# Patient Record
Sex: Female | Born: 1975 | Race: White | Hispanic: No | Marital: Married | Smoking: Never smoker
Health system: Southern US, Community
[De-identification: ages and names within clinical notes are randomized; demographics above are authoritative.]

## PROBLEM LIST (undated history)

## (undated) DIAGNOSIS — G43909 Migraine, unspecified, not intractable, without status migrainosus: Secondary | ICD-10-CM

## (undated) DIAGNOSIS — D219 Benign neoplasm of connective and other soft tissue, unspecified: Secondary | ICD-10-CM

## (undated) DIAGNOSIS — B019 Varicella without complication: Secondary | ICD-10-CM

## (undated) HISTORY — DX: Migraine, unspecified, not intractable, without status migrainosus: G43.909

## (undated) HISTORY — DX: Benign neoplasm of connective and other soft tissue, unspecified: D21.9

## (undated) HISTORY — DX: Varicella without complication: B01.9

## (undated) HISTORY — PX: BUNIONECTOMY: SHX129

---

## 2006-03-28 ENCOUNTER — Inpatient Hospital Stay: Payer: Self-pay | Admitting: Obstetrics & Gynecology

## 2015-04-06 ENCOUNTER — Ambulatory Visit (INDEPENDENT_AMBULATORY_CARE_PROVIDER_SITE_OTHER): Payer: No Typology Code available for payment source | Admitting: Sports Medicine

## 2015-04-06 ENCOUNTER — Encounter: Payer: Self-pay | Admitting: Sports Medicine

## 2015-04-06 VITALS — BP 118/41 | Ht 66.0 in | Wt 123.0 lb

## 2015-04-06 DIAGNOSIS — M25551 Pain in right hip: Secondary | ICD-10-CM | POA: Diagnosis not present

## 2015-04-06 NOTE — Progress Notes (Signed)
   Subjective:    Patient ID: Summer Wilson, female    DOB: January 08, 1976, 39 y.o.   MRN: [id number]  HPI chief complaint: Right hip and leg pain  Very pleasant 39 year old female comes in today complaining of diffuse right hip and leg pain. Symptoms have been present intermittently for the past couple of years but became more constant in December of 2015. She describes a feeling of "muscle soreness" that tends to began in the posterior right hip and at times were radiate down the right leg. She is very active and works out 5 days a week incorporating both Editor, commissioning and cardio training. She has not found that one particular exercise causes her pain to be worse. For the most part her symptoms are tolerable but they're becoming more annoying. She denies any numbness or tingling. She denies any groin pain.  Past medical history reviewed. She has a history of migraine headaches. She takes verapamil prophylactically. Surgical history significant for first metatarsal realignment in her right foot. This was done in 2000. Allergies reviewed    Review of Systems As above    Objective:   Physical Exam Well-developed, well-nourished. Fit-appearing. No acute distress. Vital signs reviewed  Right hip: Patient is tender to palpation along the right piriformis as well as the right gluteus medius. No tenderness at the greater trochanter. Positive Trendelenburg. Negative logroll. Negative straight leg raise. Weakness with resisted hip abduction.  Right knee: Full range of motion. No effusion. Slight quad weakness but not marked. Good joint stability.  Right foot: Well-healed surgical incision across the dorsum of her foot consistent with her previous first metatarsal realignment. Flexible cavus foot.  Evaluation of her running form shows excellent form with a neutral gait. No limp.       Assessment & Plan:  Right hip and leg pain likely secondary to hip weakness  Patient will add a  comprehensive hip and knee strengthening program to her workouts. She will start hip abductor strengthening, external rotator strengthening, step ups, straight leg raises, half squats, and hamstring curls. I do not see any need for orthotics. She will follow-up with me in 6 weeks. She understands that it will be several weeks before symptom resolution. In the meantime, I think she can continue with her normal workouts as tolerated but I did caution her about deep squats and leg presses. She will call with questions or concerns prior to her follow-up visit.  Total time spent with the patient was 30 minutes with greater than 50% of the time spent in face-to-face consultation discussing diagnosis and treatment.

## 2015-05-18 ENCOUNTER — Encounter: Payer: Self-pay | Admitting: Sports Medicine

## 2015-05-18 ENCOUNTER — Ambulatory Visit (INDEPENDENT_AMBULATORY_CARE_PROVIDER_SITE_OTHER): Payer: No Typology Code available for payment source | Admitting: Sports Medicine

## 2015-05-18 VITALS — BP 101/59

## 2015-05-18 DIAGNOSIS — M25551 Pain in right hip: Secondary | ICD-10-CM | POA: Diagnosis not present

## 2015-05-18 NOTE — Progress Notes (Signed)
   Subjective:    Patient ID: Summer Wilson, female    DOB: 1976/05/20, 39 y.o.   MRN: [id number]  HPI  Patient comes in today for follow-up on right hip and leg pain secondary to hip weakness. Pain has improved dramatically over the past 6 weeks. She was given a comprehensive hip and lower extremity strengthening program and she has been very compliant. As a result her pain has nearly resolved. She has resumed some running without pain. She is pleased with her progress to date.    Review of Systems     Objective:   Physical Exam  Well-developed, fit appearing. No acute distress  Right hip: There is no tenderness to palpation along the piriformis. Mildly positive Trendelenburg but much improved from her previous visit. Much improved hip abductor strength as well. Neurovascularly intact distally.       Assessment & Plan:  Improved right hip and leg pain secondary to hip weakness  Patient understands the importance of continuing with her home exercises. She may continue with activity as tolerated and will follow-up with me as needed.

## 2015-10-19 ENCOUNTER — Other Ambulatory Visit: Payer: Self-pay | Admitting: Obstetrics & Gynecology

## 2015-12-24 ENCOUNTER — Other Ambulatory Visit: Payer: Self-pay | Admitting: Obstetrics & Gynecology

## 2015-12-24 DIAGNOSIS — Z1231 Encounter for screening mammogram for malignant neoplasm of breast: Secondary | ICD-10-CM

## 2015-12-29 ENCOUNTER — Ambulatory Visit
Admission: RE | Admit: 2015-12-29 | Discharge: 2015-12-29 | Disposition: A | Discharge status: Home / Self Care | Payer: No Typology Code available for payment source | Source: Ambulatory Visit | Attending: Obstetrics & Gynecology | Admitting: Obstetrics & Gynecology

## 2015-12-29 DIAGNOSIS — Z1231 Encounter for screening mammogram for malignant neoplasm of breast: Secondary | ICD-10-CM | POA: Insufficient documentation

## 2016-01-04 ENCOUNTER — Other Ambulatory Visit: Payer: Self-pay | Admitting: Obstetrics & Gynecology

## 2016-01-04 DIAGNOSIS — R928 Other abnormal and inconclusive findings on diagnostic imaging of breast: Secondary | ICD-10-CM

## 2016-01-06 ENCOUNTER — Ambulatory Visit
Admission: RE | Admit: 2016-01-06 | Discharge: 2016-01-06 | Disposition: A | Discharge status: Home / Self Care | Payer: No Typology Code available for payment source | Source: Ambulatory Visit | Attending: Obstetrics & Gynecology | Admitting: Obstetrics & Gynecology

## 2016-01-06 DIAGNOSIS — N63 Unspecified lump in breast: Secondary | ICD-10-CM | POA: Insufficient documentation

## 2016-01-06 DIAGNOSIS — R928 Other abnormal and inconclusive findings on diagnostic imaging of breast: Secondary | ICD-10-CM

## 2016-01-06 DIAGNOSIS — N6489 Other specified disorders of breast: Secondary | ICD-10-CM | POA: Diagnosis present

## 2016-11-30 ENCOUNTER — Other Ambulatory Visit: Payer: Self-pay | Admitting: Obstetrics & Gynecology

## 2016-11-30 DIAGNOSIS — R928 Other abnormal and inconclusive findings on diagnostic imaging of breast: Secondary | ICD-10-CM

## 2016-11-30 DIAGNOSIS — N6009 Solitary cyst of unspecified breast: Secondary | ICD-10-CM

## 2016-11-30 DIAGNOSIS — Z1231 Encounter for screening mammogram for malignant neoplasm of breast: Secondary | ICD-10-CM

## 2016-12-29 ENCOUNTER — Ambulatory Visit
Admission: RE | Admit: 2016-12-29 | Discharge: 2016-12-29 | Disposition: A | Discharge status: Home / Self Care | Payer: No Typology Code available for payment source | Source: Ambulatory Visit | Attending: Obstetrics & Gynecology | Admitting: Obstetrics & Gynecology

## 2016-12-29 DIAGNOSIS — R928 Other abnormal and inconclusive findings on diagnostic imaging of breast: Secondary | ICD-10-CM | POA: Insufficient documentation

## 2016-12-29 DIAGNOSIS — N6009 Solitary cyst of unspecified breast: Secondary | ICD-10-CM

## 2016-12-29 DIAGNOSIS — Z1231 Encounter for screening mammogram for malignant neoplasm of breast: Secondary | ICD-10-CM

## 2018-01-08 ENCOUNTER — Other Ambulatory Visit: Payer: Self-pay | Admitting: Obstetrics & Gynecology

## 2018-01-08 DIAGNOSIS — Z1231 Encounter for screening mammogram for malignant neoplasm of breast: Secondary | ICD-10-CM

## 2018-01-15 ENCOUNTER — Ambulatory Visit
Admission: RE | Admit: 2018-01-15 | Discharge: 2018-01-15 | Disposition: A | Discharge status: Home / Self Care | Payer: No Typology Code available for payment source | Source: Ambulatory Visit | Attending: Obstetrics & Gynecology | Admitting: Obstetrics & Gynecology

## 2018-01-15 DIAGNOSIS — Z1231 Encounter for screening mammogram for malignant neoplasm of breast: Secondary | ICD-10-CM | POA: Diagnosis present

## 2018-01-15 DIAGNOSIS — R928 Other abnormal and inconclusive findings on diagnostic imaging of breast: Secondary | ICD-10-CM | POA: Insufficient documentation

## 2018-01-16 ENCOUNTER — Other Ambulatory Visit: Payer: Self-pay | Admitting: Obstetrics & Gynecology

## 2018-01-16 DIAGNOSIS — R928 Other abnormal and inconclusive findings on diagnostic imaging of breast: Secondary | ICD-10-CM

## 2018-01-23 ENCOUNTER — Encounter: Payer: Self-pay | Admitting: Obstetrics & Gynecology

## 2018-01-24 ENCOUNTER — Ambulatory Visit: Payer: Self-pay | Admitting: Obstetrics & Gynecology

## 2018-01-25 ENCOUNTER — Ambulatory Visit
Admission: RE | Admit: 2018-01-25 | Discharge: 2018-01-25 | Disposition: A | Discharge status: Home / Self Care | Payer: No Typology Code available for payment source | Source: Ambulatory Visit | Attending: Obstetrics & Gynecology | Admitting: Obstetrics & Gynecology

## 2018-01-25 DIAGNOSIS — R928 Other abnormal and inconclusive findings on diagnostic imaging of breast: Secondary | ICD-10-CM | POA: Insufficient documentation

## 2018-01-25 DIAGNOSIS — N6001 Solitary cyst of right breast: Secondary | ICD-10-CM | POA: Insufficient documentation

## 2018-01-29 ENCOUNTER — Other Ambulatory Visit: Payer: No Typology Code available for payment source

## 2018-01-29 ENCOUNTER — Ambulatory Visit: Payer: No Typology Code available for payment source

## 2018-02-09 ENCOUNTER — Ambulatory Visit (INDEPENDENT_AMBULATORY_CARE_PROVIDER_SITE_OTHER): Payer: No Typology Code available for payment source | Admitting: Obstetrics & Gynecology

## 2018-02-09 ENCOUNTER — Encounter: Payer: Self-pay | Admitting: Obstetrics & Gynecology

## 2018-02-09 VITALS — BP 100/60 | HR 78 | Ht 66.0 in | Wt 129.0 lb

## 2018-02-09 DIAGNOSIS — Z1329 Encounter for screening for other suspected endocrine disorder: Secondary | ICD-10-CM

## 2018-02-09 DIAGNOSIS — Z131 Encounter for screening for diabetes mellitus: Secondary | ICD-10-CM

## 2018-02-09 DIAGNOSIS — Z13220 Encounter for screening for lipoid disorders: Secondary | ICD-10-CM

## 2018-02-09 DIAGNOSIS — Z1321 Encounter for screening for nutritional disorder: Secondary | ICD-10-CM

## 2018-02-09 DIAGNOSIS — Z0000 Encounter for general adult medical examination without abnormal findings: Secondary | ICD-10-CM

## 2018-02-09 NOTE — Progress Notes (Signed)
HPI:      [name] is a 42 y.o. [id number] who LMP was Patient's last menstrual period was 01/22/2018., she presents today for her annual examination. The patient has no complaints today. The patient is sexually active. Her last pap: approximate date 2018 and was normal and last mammogram: approximate date 2019 (March) and was abnormal: she requires repeat testing due to right breast density; [place] and [facility] MMG were normal w recommendations for follow up in 6 months. The patient does perform self breast exams.  There is notable family history of breast or ovarian cancer in her family.  The patient has regular exercise: yes.  The patient denies current symptoms of depression.    GYN History: Contraception: none  PMHx: Past Medical History:  Diagnosis Date  . Migraine    History reviewed. No pertinent surgical history. Family History  Problem Relation Age of Onset  . Diabetes Father   . Heart disease Father   . Hyperlipidemia Father   . Hypertension Father   . Stroke Maternal Grandmother   . Colon cancer Paternal Grandfather   . Brain cancer Maternal Aunt   . Brain cancer Maternal Uncle   . Breast cancer Cousin    Social History   Tobacco Use  . Smoking status: Never Smoker  . Smokeless tobacco: Never Used  Substance Use Topics  . Alcohol use: Yes    Alcohol/week: 0.0 oz  . Drug use: Never    Current Outpatient Medications:  .  verapamil (CALAN) 120 MG tablet, , Disp: , Rfl:  Allergies: Macrobid [nitrofurantoin macrocrystal]  Review of Systems  Constitutional: Negative for chills, fever and malaise/fatigue.  HENT: Negative for congestion, sinus pain and sore throat.   Eyes: Negative for blurred vision and pain.  Respiratory: Negative for cough and wheezing.   Cardiovascular: Negative for chest pain and leg swelling.  Gastrointestinal: Negative for abdominal pain, constipation, diarrhea, heartburn, nausea and vomiting.  Genitourinary: Negative for dysuria, frequency,  hematuria and urgency.  Musculoskeletal: Negative for back pain, joint pain, myalgias and neck pain.  Skin: Negative for itching and rash.  Neurological: Negative for dizziness, tremors and weakness.  Endo/Heme/Allergies: Does not bruise/bleed easily.  Psychiatric/Behavioral: Negative for depression. The patient is not nervous/anxious and does not have insomnia.     Objective: BP 100/60   Pulse 78   Ht 5\' 6"  (1.676 m)   Wt 129 lb (58.5 kg)   LMP 01/22/2018   BMI 20.82 kg/m   Filed Weights   02/09/18 0854  Weight: 129 lb (58.5 kg)   Body mass index is 20.82 kg/m. Physical Exam  Constitutional: She is oriented to person, place, and time. She appears well-developed and well-nourished. No distress.  Genitourinary: Rectum normal, vagina normal and uterus normal. Pelvic exam was performed with patient supine. There is no rash or lesion on the right labia. There is no rash or lesion on the left labia. Vagina exhibits no lesion. No bleeding in the vagina. Right adnexum does not display mass and does not display tenderness. Left adnexum does not display mass and does not display tenderness. Cervix does not exhibit motion tenderness, lesion, friability or polyp.   Uterus is mobile and midaxial. Uterus is not enlarged or exhibiting a mass.  HENT:  Head: Normocephalic and atraumatic. Head is without laceration.  Right Ear: Hearing normal.  Left Ear: Hearing normal.  Nose: No epistaxis.  No foreign bodies.  Mouth/Throat: Uvula is midline, oropharynx is clear and moist and mucous membranes are  normal.  Eyes: Pupils are equal, round, and reactive to light.  Neck: Normal range of motion. Neck supple. No thyromegaly present.  Cardiovascular: Normal rate and regular rhythm. Exam reveals no gallop and no friction rub.  No murmur heard. Pulmonary/Chest: Effort normal and breath sounds normal. No respiratory distress. She has no wheezes. Right breast exhibits no mass, no skin change and no tenderness.  Left breast exhibits no mass, no skin change and no tenderness.  Abdominal: Soft. Bowel sounds are normal. She exhibits no distension. There is no tenderness. There is no rebound.  Musculoskeletal: Normal range of motion.  Neurological: She is alert and oriented to person, place, and time. No cranial nerve deficit.  Skin: Skin is warm and dry.  Psychiatric: She has a normal mood and affect. Judgment normal.  Vitals reviewed.   Assessment:  ANNUAL EXAM 1. Annual physical exam   2. Screening for thyroid disorder   3. Screening for cholesterol level   4. Encounter for vitamin deficiency screening   5. Screening for diabetes mellitus      Screening Plan:            1.  Cervical Screening-  Pap smear schedule reviewed with patient  2. Breast screening- Exam annually and mammogram>40 planned   3. Colonoscopy every 10 years, Hemoccult testing - after age 45  4. Labs To return fasting at a later date  5. Counseling for contraception: no method, counseled as to IUD, BTL, or vasectomy   6. Rec Calcium and Vit D  Other:  1. Annual physical exam  2. Screening for thyroid disorder - TSH; Future  3. Screening for cholesterol level - Lipid panel; Future  4. Encounter for vitamin deficiency screening - Vitamin B12; Future - VITAMIN D 25 Hydroxy (Vit-D Deficiency, Fractures); Future  5. Screening for diabetes mellitus - Hemoglobin A1c; Future    F/U  Return in about 1 year (around 02/10/2019) for Annual and Lab Appt Soon.  Barnett Applebaum, MD, [facility], [facility] 02/09/2018  9:35 AM

## 2018-02-09 NOTE — Patient Instructions (Signed)
Calcium; Vitamin D oral tablets or Chewables What is this medicine? CALCIUM; VITAMIN D (KAL see um; VYE ta min D) is a vitamin supplement. It is used to prevent conditions of low calcium and vitamin D. This medicine may be used for other purposes; ask your health care provider or pharmacist if you have questions. COMMON BRAND NAME(S): Calcarb 600 with Vitamin D, Calcet Plus Vitamin D, Calcitrate + D, Calcium Citrate + D3 Maximum, Calcium Citrate Maximum with D, Caltrate, Caltrate 600+D, Citracal + D, Citracal MAXIMUM + D, Citracal Petites with Vitamin D, Citrus Calcium Plus D, OSCAL 500 + D, OSCAL Calcium + D3, OSCAL Extra D3, Osteo-Poretical, Oysco 500 + D, Oysco D, Oystercal-D Calcium What should I tell my health care provider before I take this medicine? They need to know if you have any of these conditions: -constipation -dehydration -heart disease -high level of calcium or vitamin D in the blood -high level of phosphate in the blood -kidney disease -kidney stones -liver disease -parathyroid disease -sarcoidosis -stomach ulcer or obstruction -an unusual or allergic reaction to calcium, vitamin D, tartrazine dye, other medicines, foods, dyes, or preservatives -pregnant or trying to get pregnant -breast-feeding How should I use this medicine? Take this medicine by mouth with a glass of water. Follow the directions on the label. Take with food or within 1 hour after a meal. Take your medicine at regular intervals. Do not take your medicine more often than directed. Talk to your pediatrician regarding the use of this medicine in children. While this medicine may be used in children for selected conditions, precautions do apply. Overdosage: If you think you have taken too much of this medicine contact a poison control center or emergency room at once. NOTE: This medicine is only for you. Do not share this medicine with others. What if I miss a dose? If you miss a dose, take it as soon as  you can. If it is almost time for your next dose, take only that dose. Do not take double or extra doses. What may interact with this medicine? Do not take this medicine with any of the following medications: -ammonium chloride -methenamine This medicine may also interact with the following medications: -antibiotics like ciprofloxacin, gatifloxacin, tetracycline -captopril -delavirdine -diuretics -gabapentin -iron supplements -medicines for fungal infections like ketoconazole and itraconazole -medicines for seizures like ethotoin and phenytoin -mineral oil -mycophenolate -other vitamins with calcium, vitamin D, or minerals -quinidine -rosuvastatin -sucralfate -thyroid medicine This list may not describe all possible interactions. Give your health care provider a list of all the medicines, herbs, non-prescription drugs, or dietary supplements you use. Also tell them if you smoke, drink alcohol, or use illegal drugs. Some items may interact with your medicine. What should I watch for while using this medicine? Taking this medicine is not a substitute for a well-balanced diet and exercise. Talk with your doctor or health care provider and follow a healthy lifestyle. Do not take this medicine with high-fiber foods, large amounts of alcohol, or drinks containing caffeine. Do not take this medicine within 2 hours of any other medicines. What side effects may I notice from receiving this medicine? Side effects that you should report to your doctor or health care professional as soon as possible: -allergic reactions like skin rash, itching or hives, swelling of the face, lips, or tongue -confusion -dry mouth -high blood pressure -increased hunger or thirst -increased urination -irregular heartbeat -metallic taste -muscle or bone pain -pain when urinating -seizure -unusually weak or tired -  weight loss Side effects that usually do not require medical attention (report to your doctor or  health care professional if they continue or are bothersome): -constipation -diarrhea -headache -loss of appetite -nausea, vomiting -stomach upset This list may not describe all possible side effects. Call your doctor for medical advice about side effects. You may report side effects to FDA at 1-800-FDA-1088. Where should I keep my medicine? Keep out of the reach of children. Store at room temperature between 15 and 30 degrees C (59 and 86 degrees F). Protect from light. Keep container tightly closed. Throw away any unused medicine after the expiration date. NOTE: This sheet is a summary. It may not cover all possible information. If you have questions about this medicine, talk to your doctor, pharmacist, or health care provider.  2018 Elsevier/Gold Standard (2008-02-20 17:56:23)

## 2018-02-12 ENCOUNTER — Other Ambulatory Visit: Payer: No Typology Code available for payment source

## 2018-02-14 ENCOUNTER — Other Ambulatory Visit: Payer: No Typology Code available for payment source

## 2018-02-14 DIAGNOSIS — Z13220 Encounter for screening for lipoid disorders: Secondary | ICD-10-CM

## 2018-02-14 DIAGNOSIS — Z1329 Encounter for screening for other suspected endocrine disorder: Secondary | ICD-10-CM

## 2018-02-14 DIAGNOSIS — Z131 Encounter for screening for diabetes mellitus: Secondary | ICD-10-CM

## 2018-02-14 DIAGNOSIS — Z1321 Encounter for screening for nutritional disorder: Secondary | ICD-10-CM

## 2018-02-15 ENCOUNTER — Encounter: Payer: Self-pay | Admitting: Obstetrics & Gynecology

## 2018-02-15 LAB — LIPID PANEL
Chol/HDL Ratio: 2.9 ratio (ref 0.0–4.4)
Cholesterol, Total: 171 mg/dL (ref 100–199)
HDL: 58 mg/dL (ref >39)
LDL CALC: 90 mg/dL (ref 0–99)
TRIGLYCERIDES: 116 mg/dL (ref 0–149)
VLDL Cholesterol Cal: 23 mg/dL (ref 5–40)

## 2018-02-15 LAB — VITAMIN D 25 HYDROXY (VIT D DEFICIENCY, FRACTURES): VIT D 25 HYDROXY: 41.3 ng/mL (ref 30.0–100.0)

## 2018-02-15 LAB — HEMOGLOBIN A1C
Est. average glucose Bld gHb Est-mCnc: 91 mg/dL
HEMOGLOBIN A1C: 4.8 % (ref 4.8–5.6)

## 2018-02-15 LAB — VITAMIN B12: VITAMIN B 12: 363 pg/mL (ref 232–1245)

## 2018-02-15 LAB — TSH: TSH: 1.62 u[IU]/mL (ref 0.450–4.500)

## 2018-07-19 ENCOUNTER — Telehealth: Payer: Self-pay

## 2018-07-19 ENCOUNTER — Other Ambulatory Visit: Payer: Self-pay | Admitting: Obstetrics & Gynecology

## 2018-07-19 DIAGNOSIS — N6001 Solitary cyst of right breast: Secondary | ICD-10-CM

## 2018-07-19 DIAGNOSIS — R928 Other abnormal and inconclusive findings on diagnostic imaging of breast: Secondary | ICD-10-CM

## 2018-07-19 NOTE — Telephone Encounter (Signed)
Already in system, do they need something else?

## 2018-07-19 NOTE — Telephone Encounter (Signed)
Pt needs order for a diagnostic mammogram

## 2018-07-19 NOTE — Telephone Encounter (Signed)
lmtrc

## 2018-07-19 NOTE — Telephone Encounter (Signed)
They need you to sign the order

## 2018-08-09 ENCOUNTER — Other Ambulatory Visit: Payer: Self-pay | Admitting: Obstetrics & Gynecology

## 2018-08-09 ENCOUNTER — Telehealth: Payer: Self-pay

## 2018-08-09 DIAGNOSIS — R928 Other abnormal and inconclusive findings on diagnostic imaging of breast: Secondary | ICD-10-CM

## 2018-08-09 NOTE — Telephone Encounter (Signed)
Patient is trying to schedule mammogram. Patient states she can't schedule because it is the incorrect order and it needs to be signed. Patient is getting frustrated be she can't schedule her mammogram. Please advise

## 2018-08-09 NOTE — Telephone Encounter (Signed)
I have sent in new order for DIAGNOSTIC MMG, please check and see what the hold up is for her to be able to schedule, or help schedule for her

## 2018-08-09 NOTE — Telephone Encounter (Signed)
Patient is scheduled for Friday, 08/17/18 @ 1:20pm and is aware of appointment.

## 2018-08-09 NOTE — Telephone Encounter (Signed)
Pt needs the order for the mammo to be a 3d diagnostic or if you can sign off on the order that the mammogram office put in

## 2018-08-13 NOTE — Telephone Encounter (Signed)
Can this message be completed?

## 2018-08-17 ENCOUNTER — Ambulatory Visit
Admission: RE | Admit: 2018-08-17 | Discharge: 2018-08-17 | Disposition: A | Discharge status: Home / Self Care | Payer: No Typology Code available for payment source | Source: Ambulatory Visit | Attending: Obstetrics & Gynecology | Admitting: Obstetrics & Gynecology

## 2018-08-17 ENCOUNTER — Other Ambulatory Visit: Payer: Self-pay | Admitting: Obstetrics & Gynecology

## 2018-08-17 DIAGNOSIS — N6001 Solitary cyst of right breast: Secondary | ICD-10-CM | POA: Diagnosis not present

## 2019-02-05 ENCOUNTER — Other Ambulatory Visit: Payer: Self-pay | Admitting: Obstetrics & Gynecology

## 2019-02-05 DIAGNOSIS — N6001 Solitary cyst of right breast: Secondary | ICD-10-CM

## 2019-02-08 ENCOUNTER — Telehealth: Payer: Self-pay | Admitting: Obstetrics & Gynecology

## 2019-02-08 NOTE — Telephone Encounter (Signed)
Left message to make pt aware order in just needs to schedule

## 2019-02-08 NOTE — Telephone Encounter (Signed)
Orders signed and ready for scheduling, as radiology sees fit in these times

## 2019-02-08 NOTE — Telephone Encounter (Signed)
Patient is calling stating [facility] is sending over a order for her Mammograms. Advise patient [place] isn't in the office today will return on Monday message being sent to make him aware of the order.

## 2019-02-18 ENCOUNTER — Ambulatory Visit
Admission: RE | Admit: 2019-02-18 | Discharge: 2019-02-18 | Disposition: A | Discharge status: Home / Self Care | Payer: No Typology Code available for payment source | Source: Ambulatory Visit | Attending: Obstetrics & Gynecology | Admitting: Obstetrics & Gynecology

## 2019-02-18 ENCOUNTER — Other Ambulatory Visit: Payer: Self-pay

## 2019-02-18 DIAGNOSIS — N6001 Solitary cyst of right breast: Secondary | ICD-10-CM

## 2019-02-19 ENCOUNTER — Encounter: Payer: Self-pay | Admitting: Obstetrics & Gynecology

## 2019-07-05 ENCOUNTER — Telehealth: Payer: Self-pay

## 2019-07-05 ENCOUNTER — Other Ambulatory Visit: Payer: Self-pay | Admitting: Obstetrics & Gynecology

## 2019-07-05 NOTE — Telephone Encounter (Signed)
Pt aware note at front desk

## 2019-07-05 NOTE — Telephone Encounter (Signed)
Pt states she thinks it is for liability purposes since phase 2 has started. State there are no restrictions and she needs the exercise to help lower cholesterol. Something along those lines.

## 2019-07-05 NOTE — Telephone Encounter (Signed)
What does the note need to say?

## 2019-07-05 NOTE — Telephone Encounter (Signed)
Pt states she has an upcoming annual appointment scheduled but was wanting RPH to write a note so she can get access to the gym. CB# [phone]

## 2019-07-16 ENCOUNTER — Ambulatory Visit: Payer: No Typology Code available for payment source | Admitting: Obstetrics & Gynecology

## 2019-07-17 ENCOUNTER — Ambulatory Visit: Payer: No Typology Code available for payment source | Admitting: Obstetrics & Gynecology

## 2019-08-02 ENCOUNTER — Other Ambulatory Visit (HOSPITAL_COMMUNITY)
Admission: RE | Admit: 2019-08-02 | Discharge: 2019-08-02 | Disposition: A | Discharge status: Home / Self Care | Payer: No Typology Code available for payment source | Source: Ambulatory Visit | Attending: Obstetrics & Gynecology | Admitting: Obstetrics & Gynecology

## 2019-08-02 ENCOUNTER — Encounter: Payer: Self-pay | Admitting: Obstetrics & Gynecology

## 2019-08-02 ENCOUNTER — Ambulatory Visit (INDEPENDENT_AMBULATORY_CARE_PROVIDER_SITE_OTHER): Payer: No Typology Code available for payment source | Admitting: Obstetrics & Gynecology

## 2019-08-02 ENCOUNTER — Other Ambulatory Visit: Payer: Self-pay

## 2019-08-02 VITALS — BP 100/60 | Ht 66.0 in | Wt 129.0 lb

## 2019-08-02 DIAGNOSIS — Z124 Encounter for screening for malignant neoplasm of cervix: Secondary | ICD-10-CM | POA: Diagnosis not present

## 2019-08-02 DIAGNOSIS — Z1239 Encounter for other screening for malignant neoplasm of breast: Secondary | ICD-10-CM

## 2019-08-02 DIAGNOSIS — Z01419 Encounter for gynecological examination (general) (routine) without abnormal findings: Secondary | ICD-10-CM

## 2019-08-02 NOTE — Patient Instructions (Signed)
PAP done  Mammogram every year    Call [phone] to schedule at [facility] in Mar 2021 Labs next year (2019- all normal!)

## 2019-08-02 NOTE — Progress Notes (Signed)
HPI:      [name] is a 43 y.o. [id number] who LMP was Patient's last menstrual period was 06/16/2019., she presents today for her annual examination. The patient has no complaints today. The patient is sexually active. Her last pap: approximate date 2017 and was normal and last mammogram: approximate date 01/2019 as a follow up, due again in 01/2020 and was normal. The patient does perform self breast exams.  There is notable family history of breast or ovarian cancer in her family.  The patient has regular exercise: yes.  The patient denies current symptoms of depression.    GYN History: Contraception: vasectomy  PMHx: Past Medical History:  Diagnosis Date  . Migraine    History reviewed. No pertinent surgical history. Family History  Problem Relation Age of Onset  . Diabetes Father   . Heart disease Father   . Hyperlipidemia Father   . Hypertension Father   . Stroke Maternal Grandmother   . Colon cancer Paternal Grandfather   . Brain cancer Maternal Aunt   . Brain cancer Maternal Uncle   . Breast cancer Cousin    Social History   Tobacco Use  . Smoking status: Never Smoker  . Smokeless tobacco: Never Used  Substance Use Topics  . Alcohol use: Yes    Alcohol/week: 0.0 standard drinks  . Drug use: Never    Current Outpatient Medications:  .  verapamil (CALAN) 120 MG tablet, , Disp: , Rfl:  Allergies: Macrobid [nitrofurantoin macrocrystal]  Review of Systems  Constitutional: Negative for chills, fever and malaise/fatigue.  HENT: Negative for congestion, sinus pain and sore throat.   Eyes: Negative for blurred vision and pain.  Respiratory: Negative for cough and wheezing.   Cardiovascular: Negative for chest pain and leg swelling.  Gastrointestinal: Negative for abdominal pain, constipation, diarrhea, heartburn, nausea and vomiting.  Genitourinary: Negative for dysuria, frequency, hematuria and urgency.  Musculoskeletal: Negative for back pain, joint pain,  myalgias and neck pain.  Skin: Negative for itching and rash.  Neurological: Negative for dizziness, tremors and weakness.  Endo/Heme/Allergies: Does not bruise/bleed easily.  Psychiatric/Behavioral: Negative for depression. The patient is not nervous/anxious and does not have insomnia.     Objective: BP 100/60   Ht 5\' 6"  (1.676 m)   Wt 129 lb (58.5 kg)   LMP 06/16/2019   BMI 20.82 kg/m   Filed Weights   08/02/19 1006  Weight: 129 lb (58.5 kg)   Body mass index is 20.82 kg/m. Physical Exam Constitutional:      General: She is not in acute distress.    Appearance: She is well-developed.  Genitourinary:     Pelvic exam was performed with patient supine.     Vagina, uterus and rectum normal.     No lesions in the vagina.     No vaginal bleeding.     No cervical motion tenderness, friability, lesion or polyp.     Uterus is mobile.     Uterus is not enlarged.     No uterine mass detected.    Uterus is midaxial.     No right or left adnexal mass present.     Right adnexa not tender.     Left adnexa not tender.  HENT:     Head: Normocephalic and atraumatic. No laceration.     Right Ear: Hearing normal.     Left Ear: Hearing normal.     Mouth/Throat:     Pharynx: Uvula midline.  Eyes:  Pupils: Pupils are equal, round, and reactive to light.  Neck:     Musculoskeletal: Normal range of motion and neck supple.     Thyroid: No thyromegaly.  Cardiovascular:     Rate and Rhythm: Normal rate and regular rhythm.     Heart sounds: No murmur. No friction rub. No gallop.   Pulmonary:     Effort: Pulmonary effort is normal. No respiratory distress.     Breath sounds: Normal breath sounds. No wheezing.  Chest:     Breasts:        Right: No mass, skin change or tenderness.        Left: No mass, skin change or tenderness.  Abdominal:     General: Bowel sounds are normal. There is no distension.     Palpations: Abdomen is soft.     Tenderness: There is no abdominal tenderness.  There is no rebound.  Musculoskeletal: Normal range of motion.  Neurological:     Mental Status: She is alert and oriented to person, place, and time.     Cranial Nerves: No cranial nerve deficit.  Skin:    General: Skin is warm and dry.  Psychiatric:        Judgment: Judgment normal.  Vitals signs reviewed.     Assessment:  ANNUAL EXAM 1. Women's annual routine gynecological examination   2. Screening for breast cancer   3. Screening for cervical cancer      Screening Plan:            1.  Cervical Screening-  Pap smear done today  2. Breast screening- Exam annually and mammogram>40 planned; ordered for 01/2020    3. Labs managed by PCP   4. No ERT or OCP due to migraine SE to estrogen in the past  5. Counseling for contraception: vasectomy      F/U  Return in about 1 year (around 08/01/2020) for Annual.  Barnett Applebaum, MD, [facility], [facility] 08/02/2019  10:35 AM

## 2019-08-07 LAB — CYTOLOGY - PAP
Adequacy: ABSENT
Diagnosis: NEGATIVE
HPV: NOT DETECTED

## 2020-05-18 ENCOUNTER — Ambulatory Visit
Admission: RE | Admit: 2020-05-18 | Discharge: 2020-05-18 | Disposition: A | Discharge status: Home / Self Care | Payer: No Typology Code available for payment source | Source: Ambulatory Visit | Attending: Obstetrics & Gynecology | Admitting: Obstetrics & Gynecology

## 2020-05-18 DIAGNOSIS — Z1231 Encounter for screening mammogram for malignant neoplasm of breast: Secondary | ICD-10-CM | POA: Diagnosis not present

## 2020-05-18 DIAGNOSIS — Z1239 Encounter for other screening for malignant neoplasm of breast: Secondary | ICD-10-CM

## 2020-05-19 ENCOUNTER — Telehealth: Payer: Self-pay

## 2020-05-19 ENCOUNTER — Other Ambulatory Visit: Payer: Self-pay | Admitting: Obstetrics & Gynecology

## 2020-05-19 DIAGNOSIS — N6320 Unspecified lump in the left breast, unspecified quadrant: Secondary | ICD-10-CM

## 2020-05-19 DIAGNOSIS — R928 Other abnormal and inconclusive findings on diagnostic imaging of breast: Secondary | ICD-10-CM

## 2020-05-19 DIAGNOSIS — N6489 Other specified disorders of breast: Secondary | ICD-10-CM

## 2020-05-19 NOTE — Telephone Encounter (Signed)
Already done

## 2020-05-19 NOTE — Telephone Encounter (Signed)
Pt needs you to put in a order for a follow up mammogram, she is leaving to go out of Tuesday, she would like to get this done before she leaves

## 2020-05-20 NOTE — Telephone Encounter (Signed)
Pt aware.

## 2020-05-21 ENCOUNTER — Ambulatory Visit
Admission: RE | Admit: 2020-05-21 | Discharge: 2020-05-21 | Disposition: A | Discharge status: Home / Self Care | Payer: No Typology Code available for payment source | Source: Ambulatory Visit | Attending: Obstetrics & Gynecology | Admitting: Obstetrics & Gynecology

## 2020-05-21 DIAGNOSIS — R928 Other abnormal and inconclusive findings on diagnostic imaging of breast: Secondary | ICD-10-CM

## 2020-05-21 DIAGNOSIS — N6320 Unspecified lump in the left breast, unspecified quadrant: Secondary | ICD-10-CM

## 2020-05-21 DIAGNOSIS — N6489 Other specified disorders of breast: Secondary | ICD-10-CM | POA: Diagnosis present

## 2020-08-04 ENCOUNTER — Encounter: Payer: Self-pay | Admitting: Obstetrics & Gynecology

## 2020-08-04 ENCOUNTER — Ambulatory Visit (INDEPENDENT_AMBULATORY_CARE_PROVIDER_SITE_OTHER): Payer: No Typology Code available for payment source | Admitting: Obstetrics & Gynecology

## 2020-08-04 ENCOUNTER — Other Ambulatory Visit: Payer: Self-pay

## 2020-08-04 VITALS — BP 120/80 | Ht 65.0 in | Wt 130.0 lb

## 2020-08-04 DIAGNOSIS — Z01419 Encounter for gynecological examination (general) (routine) without abnormal findings: Secondary | ICD-10-CM | POA: Diagnosis not present

## 2020-08-04 DIAGNOSIS — Z1321 Encounter for screening for nutritional disorder: Secondary | ICD-10-CM

## 2020-08-04 DIAGNOSIS — Z23 Encounter for immunization: Secondary | ICD-10-CM | POA: Diagnosis not present

## 2020-08-04 DIAGNOSIS — Z131 Encounter for screening for diabetes mellitus: Secondary | ICD-10-CM | POA: Diagnosis not present

## 2020-08-04 DIAGNOSIS — Z13220 Encounter for screening for lipoid disorders: Secondary | ICD-10-CM

## 2020-08-04 DIAGNOSIS — Z1329 Encounter for screening for other suspected endocrine disorder: Secondary | ICD-10-CM

## 2020-08-04 NOTE — Patient Instructions (Addendum)
PAP every three years Mammogram every year    Call [phone] to schedule at [facility] - due  Thank you for choosing [facility]. As part of our ongoing efforts to improve patient experience, we would appreciate your feedback. Please fill out the short survey that you will receive by mail or MyChart. Your opinion is important to [place]! - Dr. Kenton Kingfisher

## 2020-08-04 NOTE — Progress Notes (Signed)
HPI:      [name] is a 44 y.o. [id number] who LMP was Patient's last menstrual period was 07/01/2020., she presents today for her annual examination. The patient has no complaints today.   Reg cycles.  Rare headache. The patient is sexually active. Her last pap: approximate date 2019 and was normal and last mammogram: approximate date 2021 and was normal and was noted to still have breast density requiring dx imaging. The patient does perform self breast exams.  There is no notable family history of breast or ovarian cancer in her family.  The patient has regular exercise: yes.  The patient denies current symptoms of depression.    GYN History: Contraception: vasectomy  PMHx: Past Medical History:  Diagnosis Date   Migraine    History reviewed. No pertinent surgical history. Family History  Problem Relation Age of Onset   Diabetes Father    Heart disease Father    Hyperlipidemia Father    Hypertension Father    Stroke Maternal Grandmother    Colon cancer Paternal Grandfather    Brain cancer Maternal Aunt    Brain cancer Maternal Uncle    Breast cancer Cousin        2nd cousin   Social History   Tobacco Use   Smoking status: Never Smoker   Smokeless tobacco: Never Used  Vaping Use   Vaping Use: Never used  Substance Use Topics   Alcohol use: Yes    Alcohol/week: 0.0 standard drinks   Drug use: Never    Current Outpatient Medications:    Multiple Vitamin (MULTI-VITAMIN) tablet, Take 1 tablet by mouth daily., Disp: , Rfl:    verapamil (CALAN) 120 MG tablet, , Disp: , Rfl:  Allergies: Macrobid [nitrofurantoin macrocrystal]  Review of Systems  Constitutional: Negative for chills, fever and malaise/fatigue.  HENT: Negative for congestion, sinus pain and sore throat.   Eyes: Negative for blurred vision and pain.  Respiratory: Negative for cough and wheezing.   Cardiovascular: Negative for chest pain and leg swelling.  Gastrointestinal: Negative  for abdominal pain, constipation, diarrhea, heartburn, nausea and vomiting.  Genitourinary: Negative for dysuria, frequency, hematuria and urgency.  Musculoskeletal: Negative for back pain, joint pain, myalgias and neck pain.  Skin: Negative for itching and rash.  Neurological: Negative for dizziness, tremors and weakness.  Endo/Heme/Allergies: Does not bruise/bleed easily.  Psychiatric/Behavioral: Negative for depression. The patient is not nervous/anxious and does not have insomnia.     Objective: BP 120/80    Ht 5\' 5"  (1.651 m)    Wt 130 lb (59 kg)    LMP 07/01/2020    BMI 21.63 kg/m   Filed Weights   08/04/20 0915  Weight: 130 lb (59 kg)   Body mass index is 21.63 kg/m. Physical Exam Constitutional:      General: She is not in acute distress.    Appearance: She is well-developed.  Genitourinary:     Pelvic exam was performed with patient supine.     Vagina, uterus and rectum normal.     No lesions in the vagina.     No vaginal bleeding.     No cervical motion tenderness, friability, lesion or polyp.     Uterus is mobile.     Uterus is not enlarged.     No uterine mass detected.    Uterus is midaxial.     No right or left adnexal mass present.     Right adnexa not tender.     Left  adnexa not tender.  HENT:     Head: Normocephalic and atraumatic. No laceration.     Right Ear: Hearing normal.     Left Ear: Hearing normal.     Mouth/Throat:     Pharynx: Uvula midline.  Eyes:     Pupils: Pupils are equal, round, and reactive to light.  Neck:     Thyroid: No thyromegaly.  Cardiovascular:     Rate and Rhythm: Normal rate and regular rhythm.     Heart sounds: No murmur heard.  No friction rub. No gallop.   Pulmonary:     Effort: Pulmonary effort is normal. No respiratory distress.     Breath sounds: Normal breath sounds. No wheezing.  Chest:     Breasts:        Right: No mass, skin change or tenderness.        Left: No mass, skin change or tenderness.  Abdominal:      General: Bowel sounds are normal. There is no distension.     Palpations: Abdomen is soft.     Tenderness: There is no abdominal tenderness. There is no rebound.  Musculoskeletal:        General: Normal range of motion.     Cervical back: Normal range of motion and neck supple.  Neurological:     Mental Status: She is alert and oriented to person, place, and time.     Cranial Nerves: No cranial nerve deficit.  Skin:    General: Skin is warm and dry.  Psychiatric:        Judgment: Judgment normal.  Vitals reviewed.     Assessment:  ANNUAL EXAM 1. Women's annual routine gynecological examination   2. Screening for cholesterol level   3. Screening for diabetes mellitus   4. Screening for thyroid disorder   5. Encounter for vitamin deficiency screening      Screening Plan:            1.  Cervical Screening-  Pap smear schedule reviewed with patient  2. Breast screening- Exam annually and mammogram>40 planned   3. Colonoscopy every 10 years, Hemoccult testing - after age 74, considering earlier  4. Labs To return fasting at a later date  5. Counseling for contraception: vasectomy No hormones due to her h/o Migraines No s/sx menopause as of yet   6/ Flu shot today    F/U  Return in about 1 year (around 08/04/2021) for Annual, and FASTING LAB appt soon.  Barnett Applebaum, MD, [facility], [facility] 08/04/2020  10:12 AM

## 2020-08-11 ENCOUNTER — Other Ambulatory Visit: Payer: Self-pay

## 2020-08-11 ENCOUNTER — Other Ambulatory Visit: Payer: No Typology Code available for payment source

## 2020-08-11 DIAGNOSIS — Z1329 Encounter for screening for other suspected endocrine disorder: Secondary | ICD-10-CM

## 2020-08-11 DIAGNOSIS — Z131 Encounter for screening for diabetes mellitus: Secondary | ICD-10-CM

## 2020-08-11 DIAGNOSIS — Z1321 Encounter for screening for nutritional disorder: Secondary | ICD-10-CM

## 2020-08-11 DIAGNOSIS — Z13220 Encounter for screening for lipoid disorders: Secondary | ICD-10-CM

## 2020-08-12 LAB — LIPID PANEL
Chol/HDL Ratio: 3.1 ratio (ref 0.0–4.4)
Cholesterol, Total: 176 mg/dL (ref 100–199)
HDL: 57 mg/dL (ref >39)
LDL Chol Calc (NIH): 100 mg/dL — ABNORMAL HIGH (ref 0–99)
Triglycerides: 106 mg/dL (ref 0–149)
VLDL Cholesterol Cal: 19 mg/dL (ref 5–40)

## 2020-08-12 LAB — VITAMIN D 25 HYDROXY (VIT D DEFICIENCY, FRACTURES): Vit D, 25-Hydroxy: 47.9 ng/mL (ref 30.0–100.0)

## 2020-08-12 LAB — TSH: TSH: 2.01 u[IU]/mL (ref 0.450–4.500)

## 2020-08-12 LAB — GLUCOSE, FASTING: Glucose, Plasma: 90 mg/dL (ref 65–99)

## 2021-06-08 ENCOUNTER — Other Ambulatory Visit: Payer: Self-pay | Admitting: Obstetrics & Gynecology

## 2021-06-08 DIAGNOSIS — Z1231 Encounter for screening mammogram for malignant neoplasm of breast: Secondary | ICD-10-CM

## 2021-06-17 ENCOUNTER — Other Ambulatory Visit: Payer: Self-pay

## 2021-06-17 ENCOUNTER — Ambulatory Visit
Admission: RE | Admit: 2021-06-17 | Discharge: 2021-06-17 | Disposition: A | Discharge status: Home / Self Care | Payer: No Typology Code available for payment source | Source: Ambulatory Visit | Attending: Obstetrics & Gynecology | Admitting: Obstetrics & Gynecology

## 2021-06-17 DIAGNOSIS — Z1231 Encounter for screening mammogram for malignant neoplasm of breast: Secondary | ICD-10-CM | POA: Diagnosis present

## 2021-08-11 ENCOUNTER — Ambulatory Visit: Payer: No Typology Code available for payment source | Admitting: Obstetrics & Gynecology

## 2021-09-06 ENCOUNTER — Ambulatory Visit: Payer: No Typology Code available for payment source | Admitting: Obstetrics & Gynecology

## 2021-10-04 ENCOUNTER — Other Ambulatory Visit: Payer: Self-pay

## 2021-10-04 ENCOUNTER — Ambulatory Visit (INDEPENDENT_AMBULATORY_CARE_PROVIDER_SITE_OTHER): Payer: No Typology Code available for payment source | Admitting: Obstetrics & Gynecology

## 2021-10-04 ENCOUNTER — Encounter: Payer: Self-pay | Admitting: Obstetrics & Gynecology

## 2021-10-04 VITALS — BP 120/80 | Ht 65.5 in | Wt 137.0 lb

## 2021-10-04 DIAGNOSIS — Z01419 Encounter for gynecological examination (general) (routine) without abnormal findings: Secondary | ICD-10-CM

## 2021-10-04 DIAGNOSIS — N858 Other specified noninflammatory disorders of uterus: Secondary | ICD-10-CM | POA: Diagnosis not present

## 2021-10-04 DIAGNOSIS — Z1211 Encounter for screening for malignant neoplasm of colon: Secondary | ICD-10-CM | POA: Diagnosis not present

## 2021-10-04 NOTE — Patient Instructions (Signed)
Recommendations to boost your immunity to prevent illness such as viral flu and colds, including covid19, are as follows:       - - -  Vitamin K2 and Vitamin D3  - - - Take Vitamin K2 at 200-300 mcg daily (usually 2-3 pills daily of the over the counter formulation). Take Vitamin D3 at 3000-4000 U daily (usually 3-4 pills daily of the over the counter formulation). Studies show that these two at high normal levels in your system are very effective in keeping your immunity so strong and protective that you will be unlikely to contract viral illness such as those listed above.  Dr Kenton Kingfisher  Thank you for choosing [facility]. As part of our ongoing efforts to improve patient experience, we would appreciate your feedback. Please fill out the short survey that you will receive by mail or MyChart. Your opinion is important to [place]! -Dr Kenton Kingfisher

## 2021-10-04 NOTE — Progress Notes (Signed)
HPI:      Summer Wilson is a 45 y.o. [id number] who LMP was Patient's last menstrual period was 09/04/2021., she presents today for her annual examination. The patient has no complaints today. The patient is sexually active. Her last pap: approximate date 2020 and was normal and last mammogram: approximate date 2022 and was normal. The patient does perform self breast exams.  There is notable family history of breast or ovarian cancer in her family.  The patient has regular exercise: yes.  The patient denies current symptoms of depression.    Cycles still regular.  No s/sx menopause.  GYN History: Contraception: vasectomy  PMHx: Past Medical History:  Diagnosis Date   Migraine    History reviewed. No pertinent surgical history. Family History  Problem Relation Age of Onset   Diabetes Father    Heart disease Father    Hyperlipidemia Father    Hypertension Father    Stroke Maternal Grandmother    Colon cancer Paternal Grandfather    Brain cancer Maternal Aunt    Brain cancer Maternal Uncle    Breast cancer Cousin        2nd cousin   Social History   Tobacco Use   Smoking status: Never   Smokeless tobacco: Never  Vaping Use   Vaping Use: Never used  Substance Use Topics   Alcohol use: Yes    Alcohol/week: 0.0 standard drinks   Drug use: Never    Current Outpatient Medications:    Multiple Vitamin (MULTI-VITAMIN) tablet, Take 1 tablet by mouth daily., Disp: , Rfl:    verapamil (CALAN) 120 MG tablet, , Disp: , Rfl:  Allergies: Macrobid [nitrofurantoin macrocrystal]  Review of Systems  Constitutional:  Negative for chills, fever and malaise/fatigue.  HENT:  Negative for congestion, sinus pain and sore throat.   Eyes:  Negative for blurred vision and pain.  Respiratory:  Negative for cough and wheezing.   Cardiovascular:  Negative for chest pain and leg swelling.  Gastrointestinal:  Negative for abdominal pain, constipation, diarrhea, heartburn, nausea and vomiting.   Genitourinary:  Negative for dysuria, frequency, hematuria and urgency.  Musculoskeletal:  Negative for back pain, joint pain, myalgias and neck pain.  Skin:  Negative for itching and rash.  Neurological:  Positive for headaches. Negative for dizziness, tremors and weakness.  Endo/Heme/Allergies:  Does not bruise/bleed easily.  Psychiatric/Behavioral:  Negative for depression. The patient is not nervous/anxious and does not have insomnia.    Objective: BP 120/80   Ht 5' 5.5" (1.664 m)   Wt 137 lb (62.1 kg)   LMP 09/04/2021   BMI 22.45 kg/m   Filed Weights   10/04/21 1424  Weight: 137 lb (62.1 kg)   Body mass index is 22.45 kg/m. Physical Exam Constitutional:      General: She is not in acute distress.    Appearance: She is well-developed.  Genitourinary:     Bladder, rectum and urethral meatus normal.     No lesions in the vagina.     Right Labia: No rash, tenderness or lesions.    Left Labia: No tenderness, lesions or rash.    No vaginal bleeding.      Right Adnexa: not tender and no mass present.    Left Adnexa: not tender and no mass present.    No cervical motion tenderness, friability, lesion or polyp.     Uterus is not enlarged.     Uterine mass present.    Uterus exam comments: Anterior to  uterus is 3 cm mass, fibroid vs right adnexal mass/cyst.     Pelvic exam was performed with patient in the lithotomy position.  Breasts:    Right: No mass, skin change or tenderness.     Left: No mass, skin change or tenderness.  HENT:     Head: Normocephalic and atraumatic. No laceration.     Right Ear: Hearing normal.     Left Ear: Hearing normal.     Mouth/Throat:     Pharynx: Uvula midline.  Eyes:     Pupils: Pupils are equal, round, and reactive to light.  Neck:     Thyroid: No thyromegaly.  Cardiovascular:     Rate and Rhythm: Normal rate and regular rhythm.     Heart sounds: No murmur heard.   No friction rub. No gallop.  Pulmonary:     Effort: Pulmonary effort  is normal. No respiratory distress.     Breath sounds: Normal breath sounds. No wheezing.  Abdominal:     General: Bowel sounds are normal. There is no distension.     Palpations: Abdomen is soft.     Tenderness: There is no abdominal tenderness. There is no rebound.  Musculoskeletal:        General: Normal range of motion.     Cervical back: Normal range of motion and neck supple.  Neurological:     Mental Status: She is alert and oriented to person, place, and time.     Cranial Nerves: No cranial nerve deficit.  Skin:    General: Skin is warm and dry.  Psychiatric:        Judgment: Judgment normal.  Vitals reviewed.    Assessment:  ANNUAL EXAM 1. Women's annual routine gynecological examination   2. Screen for colon cancer      Screening Plan:            1.  Cervical Screening-  Pap smear schedule reviewed with patient  2. Breast screening- Exam annually and mammogram>40 planned   3. Colonoscopy every 10 years, Hemoccult testing - after age 53  4. Labs  UTD; repeat next year  5. Counseling for contraception: vasectomy  The pregnancy intention screening data noted above was reviewed. Potential methods of contraception were discussed. The patient elected to proceed with Vasectomy.    6. Uterine or adnexal mass, possibly fibroid. No sx's, but pt is concerned. Plan [place] to assess.   7. No s/sx menopause. Avoid estrogen as treatment in future due to migraine hx.    F/U  Return in about 1 year (around 10/04/2022) for Annual.  Barnett Applebaum, MD, [facility], [facility] 10/04/2021  3:07 PM

## 2021-10-07 ENCOUNTER — Encounter: Payer: Self-pay | Admitting: Obstetrics & Gynecology

## 2021-10-11 ENCOUNTER — Other Ambulatory Visit: Payer: Self-pay

## 2021-10-11 ENCOUNTER — Ambulatory Visit
Admission: RE | Admit: 2021-10-11 | Discharge: 2021-10-11 | Disposition: A | Discharge status: Home / Self Care | Payer: No Typology Code available for payment source | Source: Ambulatory Visit | Attending: Obstetrics & Gynecology | Admitting: Obstetrics & Gynecology

## 2021-10-11 DIAGNOSIS — N858 Other specified noninflammatory disorders of uterus: Secondary | ICD-10-CM | POA: Diagnosis present

## 2021-10-13 ENCOUNTER — Other Ambulatory Visit: Payer: Self-pay

## 2021-10-13 DIAGNOSIS — Z1211 Encounter for screening for malignant neoplasm of colon: Secondary | ICD-10-CM

## 2021-10-13 DIAGNOSIS — G43909 Migraine, unspecified, not intractable, without status migrainosus: Secondary | ICD-10-CM | POA: Insufficient documentation

## 2021-10-13 MED ORDER — CLENPIQ 10-3.5-12 MG-GM -GM/160ML PO SOLN: 1.0000 | Freq: Once | ORAL | 0 refills | Status: AC

## 2021-10-13 NOTE — Progress Notes (Signed)
Gastroenterology Pre-Procedure Review  Request Date: 10/28/21  Requesting Physician: Dr. Allen Norris  PATIENT REVIEW QUESTIONS: The patient responded to the following health history questions as indicated:    1. Are you having any GI issues? no 2. Do you have a personal history of Polyps? no 3. Do you have a family history of Colon Cancer or Polyps? yes (P. Grandfather- colon cancer; Mother- polyps.) 4. Diabetes Mellitus? no 5. Joint replacements in the past 12 months?no 6. Major health problems in the past 3 months?no 7. Any artificial heart valves, MVP, or defibrillator?no    MEDICATIONS & ALLERGIES:    Patient reports the following regarding taking any anticoagulation/antiplatelet therapy:   Plavix, Coumadin, Eliquis, Xarelto, Lovenox, Pradaxa, Brilinta, or Effient? no Aspirin? no  Patient confirms/reports the following medications:  Current Outpatient Medications  Medication Sig Dispense Refill   Multiple Vitamin (MULTI-VITAMIN) tablet Take 1 tablet by mouth daily.     verapamil (CALAN) 120 MG tablet  (Patient not taking: No sig reported)     No current facility-administered medications for this visit.    Patient confirms/reports the following allergies:  Allergies  Allergen Reactions   Macrobid [Nitrofurantoin Macrocrystal]     No orders of the defined types were placed in this encounter.   AUTHORIZATION INFORMATION Primary Insurance: 1D#: Group #:  Secondary Insurance: 1D#: Group #:  SCHEDULE INFORMATION: Date: 10/28/21 Time: Location: [place]

## 2021-10-18 ENCOUNTER — Encounter: Payer: Self-pay | Admitting: Gastroenterology

## 2021-10-19 ENCOUNTER — Encounter: Payer: Self-pay | Admitting: Obstetrics & Gynecology

## 2021-10-19 ENCOUNTER — Ambulatory Visit: Payer: No Typology Code available for payment source | Admitting: Obstetrics & Gynecology

## 2021-10-19 ENCOUNTER — Ambulatory Visit (INDEPENDENT_AMBULATORY_CARE_PROVIDER_SITE_OTHER): Payer: No Typology Code available for payment source | Admitting: Obstetrics & Gynecology

## 2021-10-19 ENCOUNTER — Other Ambulatory Visit: Payer: Self-pay

## 2021-10-19 DIAGNOSIS — D259 Leiomyoma of uterus, unspecified: Secondary | ICD-10-CM | POA: Diagnosis not present

## 2021-10-19 DIAGNOSIS — D219 Benign neoplasm of connective and other soft tissue, unspecified: Secondary | ICD-10-CM

## 2021-10-19 NOTE — Progress Notes (Signed)
Virtual Visit via Telephone Note  I connected with Summer Wilson on 10/19/21 at  3:50 PM EST by telephone and verified that I am speaking with the correct person using two identifiers.  Location: Patient: Home Provider: Office   I discussed the limitations, risks, security and privacy concerns of performing an evaluation and management service by telephone and the availability of in person appointments. I also discussed with the patient that there may be a patient responsible charge related to this service. The patient expressed understanding and agreed to proceed.  History of Present Illness: Pt is 45 yo G2P2 with noted uterine enlargement on recent exam.  No period changes, pain, or bladder sx's.  Ultrasound demonstrates 4 fibroids, see below These findings are c/w uterine enlargement   Measurements: 11.2 x 5.3 x 7.8 cm = volume: 192 mL. Anteverted. Multiple uterine masses consistent with leiomyomata. Largest of these include a 4.7 cm diameter posterior leiomyoma, 3.9 cm diameter anterior leiomyoma, 3.0 cm exophytic fundal leiomyoma, and a 2.1 cm subserosal leiomyoma.  PMHx: She  has a past medical history of Migraine. Also,  has a past surgical history that includes Cesarean section and Bunionectomy., family history includes Brain cancer in her maternal aunt and maternal uncle; Breast cancer in her cousin; Colon cancer in her paternal grandfather; Diabetes in her father; Heart disease in her father; Hyperlipidemia in her father; Hypertension in her father; Stroke in her maternal grandmother.,  reports that she has never smoked. She has never used smokeless tobacco. She reports that she does not currently use alcohol after a past usage of about 5.0 standard drinks per week. She reports that she does not use drugs.  She has a current medication list which includes the following prescription(s): multi-vitamin and verapamil. Also, is allergic to macrobid [nitrofurantoin  macrocrystal].  Review of Systems  All other systems reviewed and are negative.  Objective: There were no vitals taken for this visit.    Observations/Objective: No exam today, due to telephone eVisit due to [facility] virus restriction on elective visits and procedures.  Prior visits reviewed along with ultrasounds/labs as indicated.  Assessment and Plan:   ICD-10-CM   1. Fibroids  D21.9     Fibroid treatment such as [place], Lupron, Myomectomy, and Hysterectomy discussed in detail, with the pros and cons of each choice counseled.  No treatment as an option also discussed, as well as control of symptoms alone with hormone therapy. Information provided to the patient.  Follow Up Instructions: Annual check ups [place] in one year to assess for rapid growth   I discussed the assessment and treatment plan with the patient. The patient was provided an opportunity to ask questions and all were answered. The patient agreed with the plan and demonstrated an understanding of the instructions.   The patient was advised to call back or seek an in-person evaluation if the symptoms worsen or if the condition fails to improve as anticipated.  I provided 22 minutes of non-face-to-face time during this encounter.   Hoyt Koch, MD

## 2021-10-28 ENCOUNTER — Encounter: Payer: Self-pay | Admitting: Gastroenterology

## 2021-10-28 ENCOUNTER — Ambulatory Visit
Admission: RE | Admit: 2021-10-28 | Discharge: 2021-10-28 | Disposition: A | Discharge status: Home / Self Care | Payer: No Typology Code available for payment source | Attending: Gastroenterology | Admitting: Gastroenterology

## 2021-10-28 ENCOUNTER — Ambulatory Visit: Payer: No Typology Code available for payment source | Admitting: Anesthesiology

## 2021-10-28 ENCOUNTER — Encounter
Admission: RE | Disposition: A | Discharge status: Home / Self Care | Payer: Self-pay | Source: Home / Self Care | Attending: Gastroenterology

## 2021-10-28 ENCOUNTER — Other Ambulatory Visit: Payer: Self-pay

## 2021-10-28 DIAGNOSIS — Z1211 Encounter for screening for malignant neoplasm of colon: Secondary | ICD-10-CM | POA: Insufficient documentation

## 2021-10-28 DIAGNOSIS — K640 First degree hemorrhoids: Secondary | ICD-10-CM | POA: Diagnosis not present

## 2021-10-28 DIAGNOSIS — K635 Polyp of colon: Secondary | ICD-10-CM | POA: Insufficient documentation

## 2021-10-28 HISTORY — PX: COLONOSCOPY WITH PROPOFOL: SHX5780

## 2021-10-28 HISTORY — PX: POLYPECTOMY: SHX5525

## 2021-10-28 LAB — POCT PREGNANCY, URINE: Preg Test, Ur: NEGATIVE

## 2021-10-28 SURGERY — COLONOSCOPY WITH PROPOFOL
Anesthesia: General | Site: Rectum

## 2021-10-28 MED ORDER — STERILE WATER FOR IRRIGATION IR SOLN
Status: DC | PRN
  Administered 2021-10-28: 200 mL

## 2021-10-28 MED ORDER — LIDOCAINE HCL (CARDIAC) PF 100 MG/5ML IV SOSY
PREFILLED_SYRINGE | INTRAVENOUS | Status: DC | PRN
  Administered 2021-10-28: 30 mg via INTRAVENOUS

## 2021-10-28 MED ORDER — SODIUM CHLORIDE 0.9 % IV SOLN: INTRAVENOUS | Status: DC

## 2021-10-28 MED ORDER — ACETAMINOPHEN 160 MG/5ML PO SOLN: 325.0000 mg | Freq: Once | ORAL | Status: DC

## 2021-10-28 MED ORDER — LACTATED RINGERS IV SOLN: INTRAVENOUS | Status: DC

## 2021-10-28 MED ORDER — ACETAMINOPHEN 325 MG PO TABS: 325.0000 mg | ORAL_TABLET | Freq: Once | ORAL | Status: DC

## 2021-10-28 MED ORDER — PROPOFOL 10 MG/ML IV BOLUS
INTRAVENOUS | Status: DC | PRN
  Administered 2021-10-28: 50 mg via INTRAVENOUS
  Administered 2021-10-28: 40 mg via INTRAVENOUS
  Administered 2021-10-28: 150 mg via INTRAVENOUS
  Administered 2021-10-28 (×2): 50 mg via INTRAVENOUS
  Administered 2021-10-28: 40 mg via INTRAVENOUS

## 2021-10-28 SURGICAL SUPPLY — 7 items
FORCEPS BIOP RAD 4 LRG CAP 4 (CUTTING FORCEPS) ×3 IMPLANT
GOWN CVR UNV OPN BCK APRN NK (MISCELLANEOUS) ×4 IMPLANT
GOWN ISOL THUMB LOOP REG UNIV (MISCELLANEOUS) ×6
KIT PRC NS LF DISP ENDO (KITS) ×2 IMPLANT
KIT PROCEDURE OLYMPUS (KITS) ×3
MANIFOLD NEPTUNE II (INSTRUMENTS) ×3 IMPLANT
WATER STERILE IRR 250ML POUR (IV SOLUTION) ×3 IMPLANT

## 2021-10-28 NOTE — Transfer of Care (Signed)
Immediate Anesthesia Transfer of Care Note  Patient: Summer Wilson  Procedure(s) Performed: COLONOSCOPY WITH PROPOFOL (Rectum) POLYPECTOMY (Rectum)  Patient Location: PACU  Anesthesia Type: General  Level of Consciousness: awake, alert  and patient cooperative  Airway and Oxygen Therapy: Patient Spontanous Breathing and Patient connected to supplemental oxygen  Post-op Assessment: Post-op Vital signs reviewed, Patient's Cardiovascular Status Stable, Respiratory Function Stable, Patent Airway and No signs of Nausea or vomiting  Post-op Vital Signs: Reviewed and stable  Complications: No notable events documented.

## 2021-10-28 NOTE — Op Note (Signed)
[facility] Gastroenterology Patient Name: Summer Wilson Procedure Date: 10/28/2021 7:56 AM MRN: [id number] Account #: [id number] Date of Birth: 06/02/1976 Admit Type: Outpatient Age: 45 Room: [facility] OR ROOM 01 Gender: Female Note Status: Finalized Instrument Name: [id number] Procedure:             Colonoscopy Indications:           Screening for colorectal malignant neoplasm Providers:             [name] MD, MD Referring MD:          Gae Dry, MD (Referring MD) Medicines:             Propofol per Anesthesia Complications:         No immediate complications. Procedure:             Pre-Anesthesia Assessment:                        - Prior to the procedure, a History and Physical was                         performed, and patient medications and allergies were                         reviewed. The patient's tolerance of previous                         anesthesia was also reviewed. The risks and benefits                         of the procedure and the sedation options and risks                         were discussed with the patient. All questions were                         answered, and informed consent was obtained. Prior                         Anticoagulants: The patient has taken no previous                         anticoagulant or antiplatelet agents. ASA Grade                         Assessment: II - A patient with mild systemic disease.                         After reviewing the risks and benefits, the patient                         was deemed in satisfactory condition to undergo the                         procedure.                        After obtaining informed consent, the colonoscope was  passed under direct vision. Throughout the procedure,                         the patient's blood pressure, pulse, and oxygen                         saturations were monitored continuously. The                         Colonoscope  was introduced through the anus and                         advanced to the the cecum, identified by appendiceal                         orifice and ileocecal valve. The colonoscopy was                         performed without difficulty. The patient tolerated                         the procedure well. The quality of the bowel                         preparation was excellent. Findings:      The perianal and digital rectal examinations were normal.      A 2 mm polyp was found in the sigmoid colon. The polyp was sessile. The       polyp was removed with a cold biopsy forceps. Resection and retrieval       were complete.      Non-bleeding internal hemorrhoids were found during retroflexion. The       hemorrhoids were Grade I (internal hemorrhoids that do not prolapse). Impression:            - One 2 mm polyp in the sigmoid colon, removed with a                         cold biopsy forceps. Resected and retrieved.                        - Non-bleeding internal hemorrhoids. Recommendation:        - Discharge patient to home.                        - Resume previous diet.                        - Continue present medications.                        - Await pathology results.                        - If the pathology report reveals adenomatous tissue,                         then repeat the colonoscopy for surveillance in 7  years if adenomatous then 10 years. Procedure Code(s):     --- Professional ---                        [phone], Colonoscopy, flexible; with biopsy, single or                         multiple Diagnosis Code(s):     --- Professional ---                        Z12.11, Encounter for screening for malignant neoplasm                         of colon                        K63.5, Polyp of colon CPT copyright 2019 American Medical Association. All rights reserved. The codes documented in this report are preliminary and upon coder review may  be revised to  meet current compliance requirements. [name] MD, MD 10/28/2021 8:24:24 AM This report has been signed electronically. Number of Addenda: 0 Note Initiated On: 10/28/2021 7:56 AM Scope Withdrawal Time: 0 hours 7 minutes 37 seconds  Total Procedure Duration: 0 hours 13 minutes 50 seconds  Estimated Blood Loss:  Estimated blood loss: none.      [facility]

## 2021-10-28 NOTE — Anesthesia Preprocedure Evaluation (Signed)
Anesthesia Evaluation  Patient identified by MRN, date of birth, ID band Patient awake    Reviewed: Allergy & Precautions, H&P , NPO status , Patient's Chart, lab work & pertinent test results  Airway Mallampati: II  TM Distance: >3 FB Neck ROM: full    Dental no notable dental hx.    Pulmonary    Pulmonary exam normal breath sounds clear to auscultation       Cardiovascular Normal cardiovascular exam Rhythm:regular Rate:Normal     Neuro/Psych  Headaches,    GI/Hepatic   Endo/Other    Renal/GU      Musculoskeletal   Abdominal   Peds  Hematology   Anesthesia Other Findings   Reproductive/Obstetrics                             Anesthesia Physical Anesthesia Plan  ASA: 2  Anesthesia Plan: General   Post-op Pain Management: Minimal or no pain anticipated   Induction: Intravenous  PONV Risk Score and Plan: 3 and Treatment may vary due to age or medical condition, Propofol infusion and TIVA  Airway Management Planned: Natural Airway  Additional Equipment:   Intra-op Plan:   Post-operative Plan:   Informed Consent: I have reviewed the patients History and Physical, chart, labs and discussed the procedure including the risks, benefits and alternatives for the proposed anesthesia with the patient or authorized representative who has indicated his/her understanding and acceptance.     Dental Advisory Given  Plan Discussed with: CRNA  Anesthesia Plan Comments:         Anesthesia Quick Evaluation

## 2021-10-28 NOTE — Anesthesia Postprocedure Evaluation (Signed)
Anesthesia Post Note  Patient: Summer Wilson  Procedure(s) Performed: COLONOSCOPY WITH PROPOFOL (Rectum) POLYPECTOMY (Rectum)     Patient location during evaluation: PACU Anesthesia Type: General Level of consciousness: awake and alert and oriented Pain management: satisfactory to patient Vital Signs Assessment: post-procedure vital signs reviewed and stable Respiratory status: spontaneous breathing, nonlabored ventilation and respiratory function stable Cardiovascular status: blood pressure returned to baseline and stable Postop Assessment: Adequate PO intake and No signs of nausea or vomiting Anesthetic complications: no   No notable events documented.  Raliegh Ip

## 2021-10-28 NOTE — Anesthesia Procedure Notes (Signed)
Date/Time: 10/28/2021 8:13 AM Performed by: Cameron Ali, CRNA Pre-anesthesia Checklist: Patient identified, Emergency Drugs available, Suction available, Timeout performed and Patient being monitored Patient Re-evaluated:Patient Re-evaluated prior to induction Oxygen Delivery Method: Nasal cannula Placement Confirmation: positive ETCO2

## 2021-10-28 NOTE — H&P (Signed)
   Summer Lame, MD [address] [place], [place] [address] Phone: [phone] Fax : [phone]  Primary Care Physician:  Gae Dry, MD Primary Gastroenterologist:  Dr. Allen Norris  Pre-Procedure History & Physical: HPI:  Summer Wilson is a 45 y.o. female is here for a screening colonoscopy.   Past Medical History:  Diagnosis Date   Migraine     Past Surgical History:  Procedure Laterality Date   BUNIONECTOMY     CESAREAN SECTION     2004, 2007    Prior to Admission medications   Medication Sig Start Date End Date Taking? Authorizing Provider  Multiple Vitamin (MULTI-VITAMIN) tablet Take 1 tablet by mouth daily.   Yes [provider]  verapamil (CALAN) 120 MG tablet  03/13/15   [provider]    Allergies as of 10/13/2021 - Review Complete 10/04/2021  Allergen Reaction Noted   Macrobid [nitrofurantoin macrocrystal]  01/23/2018    Family History  Problem Relation Age of Onset   Diabetes Father    Heart disease Father    Hyperlipidemia Father    Hypertension Father    Stroke Maternal Grandmother    Colon cancer Paternal Grandfather    Brain cancer Maternal Aunt    Brain cancer Maternal Uncle    Breast cancer Cousin        2nd cousin    Social History   Socioeconomic History   Marital status: Married    Spouse name: Not on file   Number of children: Not on file   Years of education: Not on file   Highest education level: Not on file  Occupational History   Not on file  Tobacco Use   Smoking status: Never   Smokeless tobacco: Never  Vaping Use   Vaping Use: Never used  Substance and Sexual Activity   Alcohol use: Not Currently    Alcohol/week: 5.0 standard drinks    Types: 5 Glasses of wine per week   Drug use: Never   Sexual activity: Yes    Birth control/protection: Condom  Other Topics Concern   Not on file  Social History Narrative   Not on file   Social Determinants of Health   Financial Resource Strain:  Not on file  Food Insecurity: Not on file  Transportation Needs: Not on file  Physical Activity: Not on file  Stress: Not on file  Social Connections: Not on file  Intimate Partner Violence: Not on file    Review of Systems: See HPI, otherwise negative ROS  Physical Exam: BP 138/76   Pulse 86   Temp 98.1 F (36.7 C) (Temporal)   Resp 20   Wt 59.9 kg   SpO2 100%   BMI 21.63 kg/m  General:   Alert,  pleasant and cooperative in NAD Head:  Normocephalic and atraumatic. Neck:  Supple; no masses or thyromegaly. Lungs:  Clear throughout to auscultation.    Heart:  Regular rate and rhythm. Abdomen:  Soft, nontender and nondistended. Normal bowel sounds, without guarding, and without rebound.   Neurologic:  Alert and  oriented x4;  grossly normal neurologically.  Impression/Plan: [name] is now here to undergo a screening colonoscopy.  Risks, benefits, and alternatives regarding colonoscopy have been reviewed with the patient.  Questions have been answered.  All parties agreeable.

## 2021-10-29 ENCOUNTER — Encounter: Payer: Self-pay | Admitting: Gastroenterology

## 2021-10-29 LAB — SURGICAL PATHOLOGY

## 2021-10-31 ENCOUNTER — Encounter: Payer: Self-pay | Admitting: Gastroenterology

## 2022-03-08 IMAGING — MG MM DIGITAL SCREENING BILAT W/ TOMO AND CAD
8 series · 9 of 24 positions shown · non-contrast
Comparison: Previous exam(s).

CLINICAL DATA: Screening.

EXAM:
DIGITAL SCREENING BILATERAL MAMMOGRAM WITH TOMOSYNTHESIS AND CAD
TECHNIQUE: Bilateral screening digital craniocaudal and mediolateral oblique
mammograms were obtained. Bilateral screening digital breast
tomosynthesis was performed. The images were evaluated with
computer-aided detection.

[R MLO synth-2D]
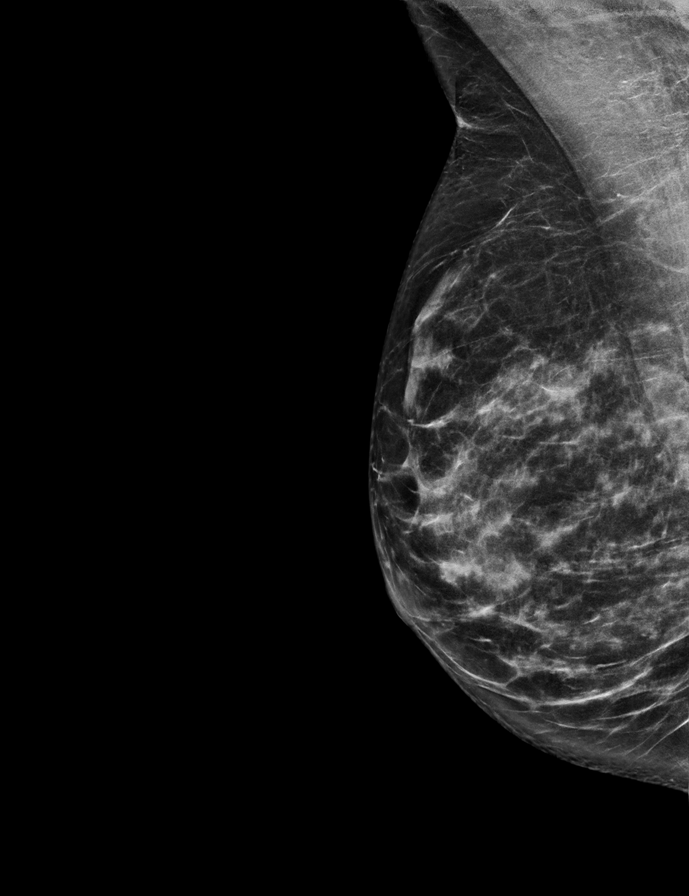

[L MLO synth-2D]
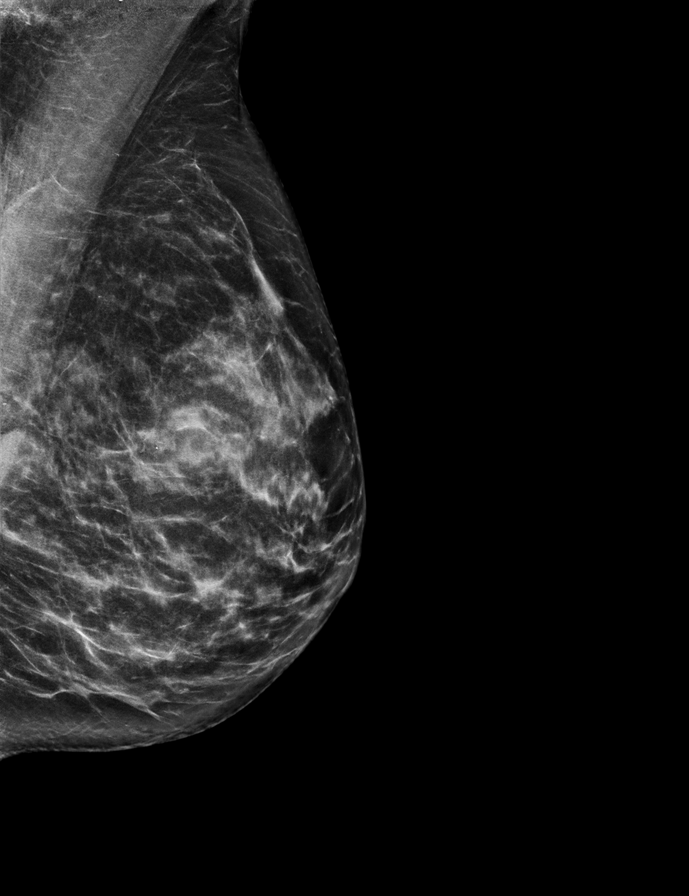

[R CC synth-2D]
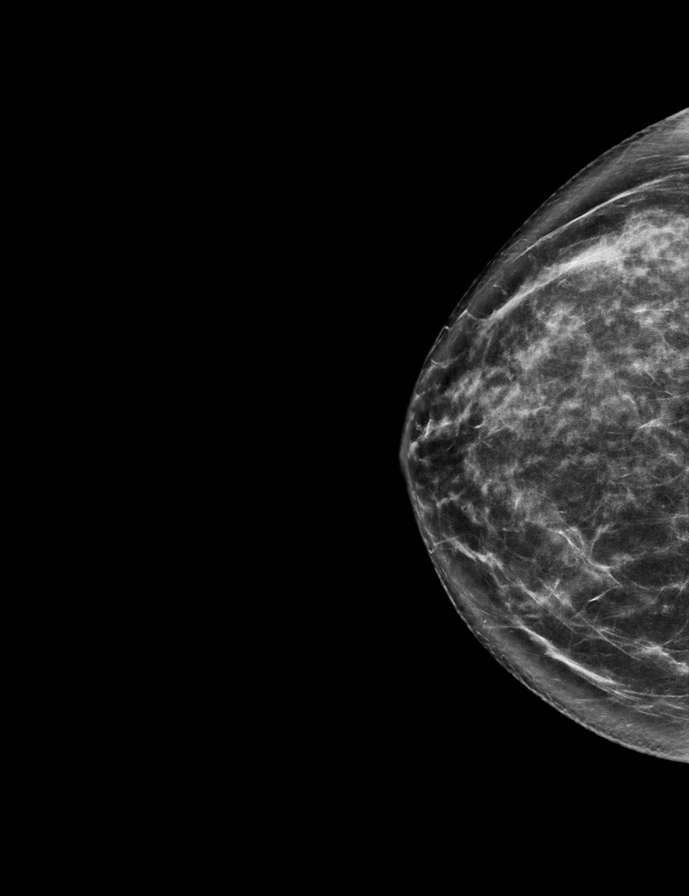

[L CC synth-2D]
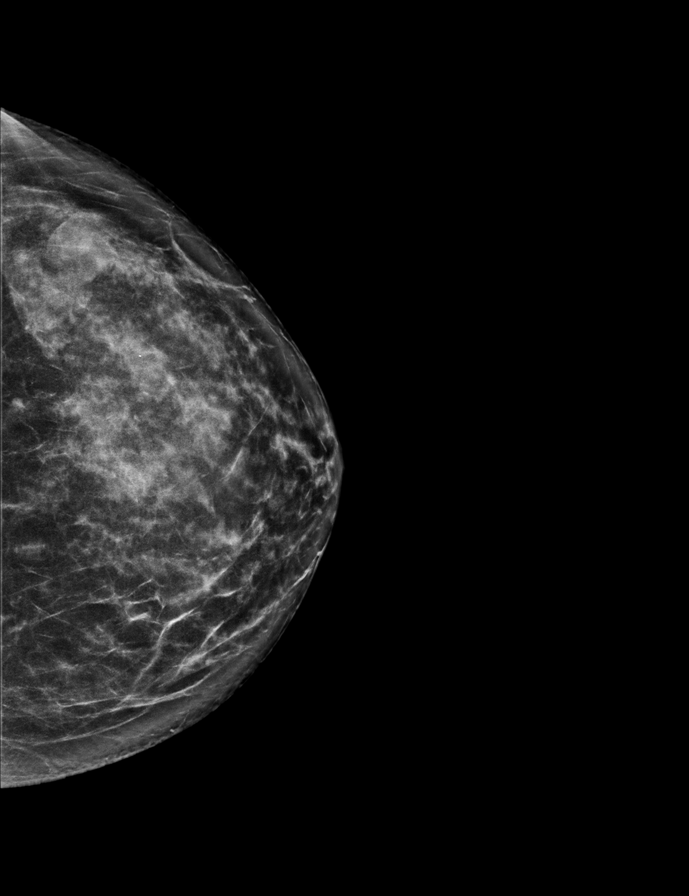

[R CC tomo · 2 of 69 frames shown]
[frame 23/69]
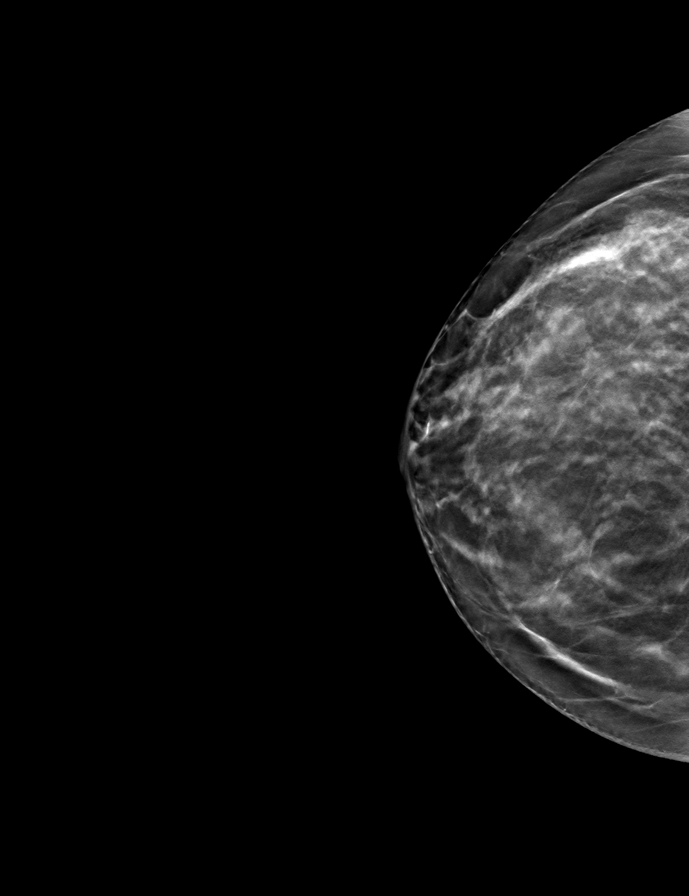
[frame 35/69]
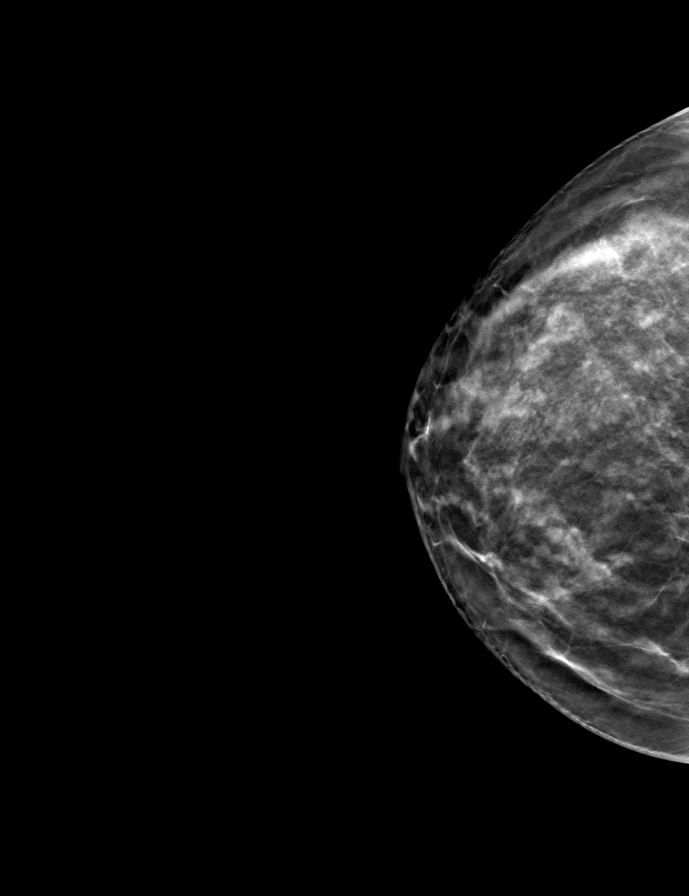

[R MLO tomo · tomo slice 33/64.0]
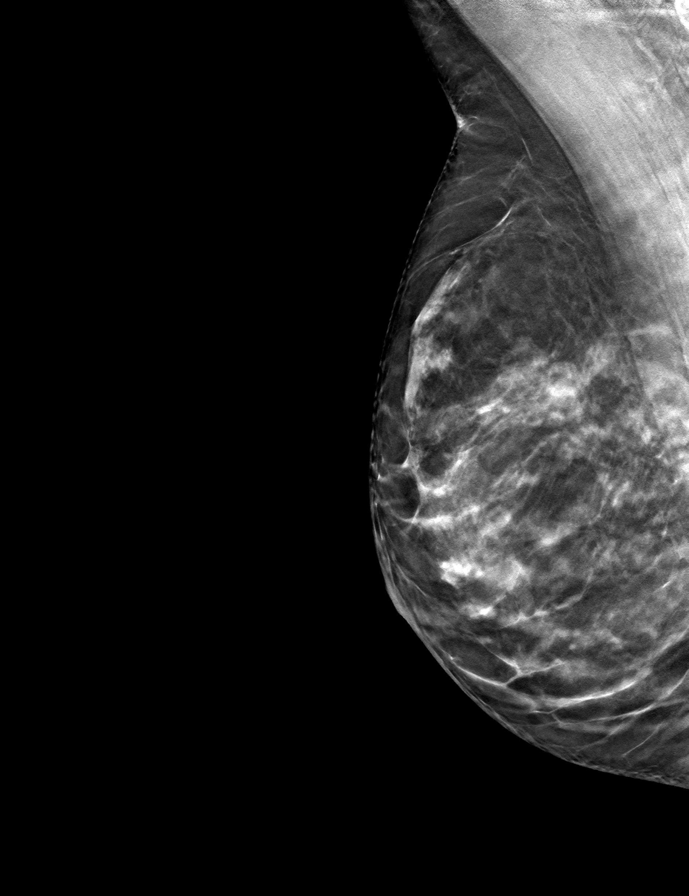

[L MLO tomo · tomo slice 33/64.0]
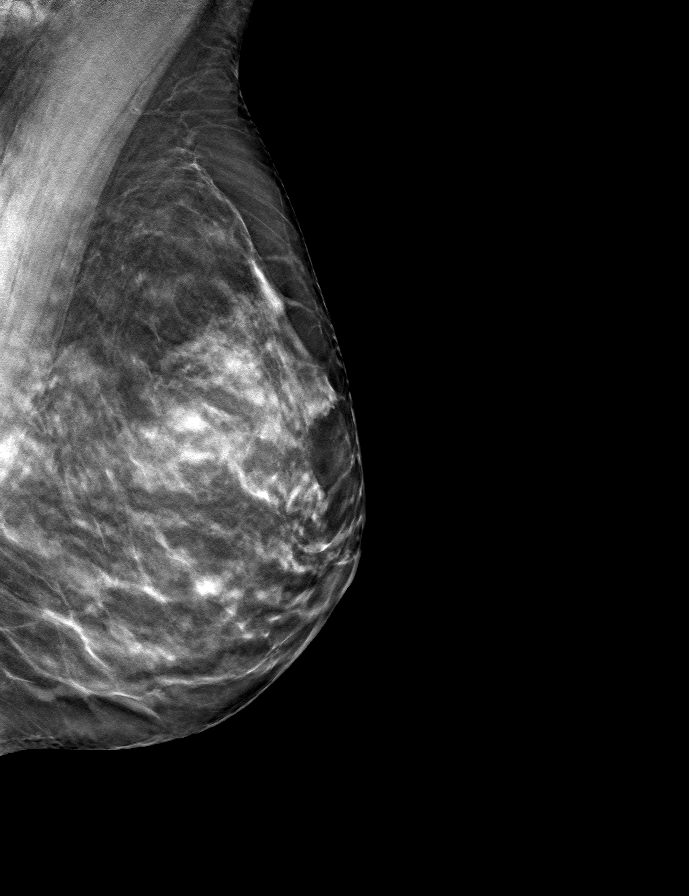

[L CC tomo · tomo slice 35/68.0]
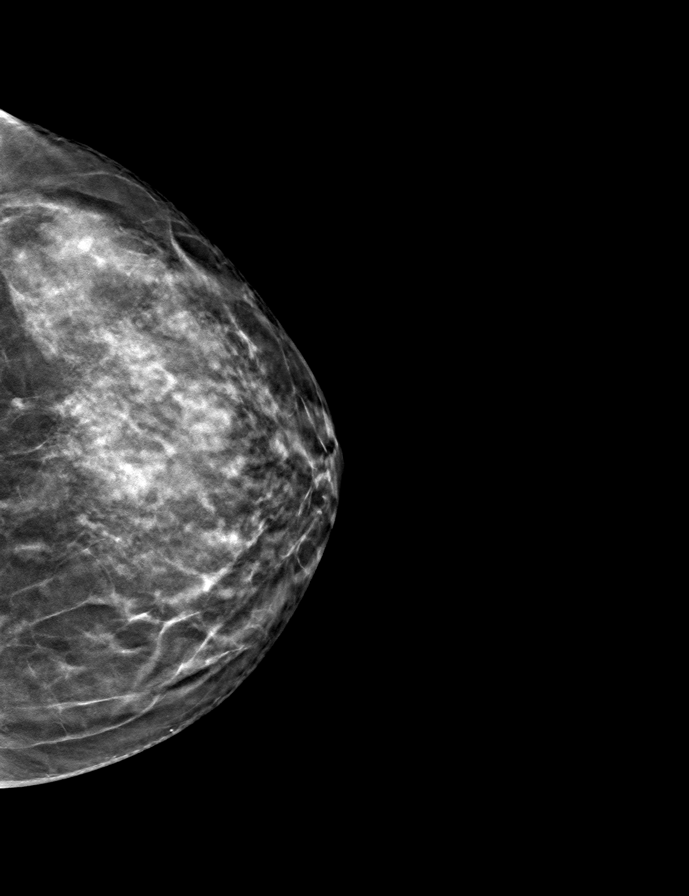

[9 of 24 positions shown; findings below may reference images not displayed]

ACR Breast Density Category c: The breast tissue is heterogeneously
dense, which may obscure small masses.
FINDINGS: There are no findings suspicious for malignancy.
IMPRESSION: No mammographic evidence of malignancy. A result letter of this
screening mammogram will be mailed directly to the patient.

RECOMMENDATION:
Screening mammogram in one year. (Code:Q3-W-BC3)

BI-RADS CATEGORY  1: Negative.

## 2022-06-15 ENCOUNTER — Other Ambulatory Visit: Payer: Self-pay | Admitting: Obstetrics & Gynecology

## 2022-06-15 ENCOUNTER — Other Ambulatory Visit: Payer: Self-pay | Admitting: Family Medicine

## 2022-06-15 DIAGNOSIS — Z1231 Encounter for screening mammogram for malignant neoplasm of breast: Secondary | ICD-10-CM

## 2022-08-09 ENCOUNTER — Ambulatory Visit
Admission: RE | Admit: 2022-08-09 | Discharge: 2022-08-09 | Disposition: A | Discharge status: Home / Self Care | Payer: No Typology Code available for payment source | Source: Ambulatory Visit | Attending: Obstetrics & Gynecology | Admitting: Obstetrics & Gynecology

## 2022-08-09 DIAGNOSIS — Z1231 Encounter for screening mammogram for malignant neoplasm of breast: Secondary | ICD-10-CM | POA: Insufficient documentation

## 2023-08-08 ENCOUNTER — Other Ambulatory Visit: Payer: Self-pay | Admitting: Obstetrics & Gynecology

## 2023-08-08 DIAGNOSIS — Z1231 Encounter for screening mammogram for malignant neoplasm of breast: Secondary | ICD-10-CM

## 2023-08-30 ENCOUNTER — Ambulatory Visit
Admission: RE | Admit: 2023-08-30 | Discharge: 2023-08-30 | Disposition: A | Discharge status: Home / Self Care | Payer: No Typology Code available for payment source | Source: Ambulatory Visit | Attending: Obstetrics & Gynecology | Admitting: Obstetrics & Gynecology

## 2023-08-30 DIAGNOSIS — Z1231 Encounter for screening mammogram for malignant neoplasm of breast: Secondary | ICD-10-CM | POA: Insufficient documentation

## 2024-06-17 ENCOUNTER — Telehealth: Payer: Self-pay | Admitting: Internal Medicine

## 2024-06-17 NOTE — Telephone Encounter (Signed)
 Ok

## 2024-06-17 NOTE — Telephone Encounter (Signed)
 Copied from CRM [id number]. Topic: Appointments - Scheduling Inquiry for Clinic >> Jun 17, 2024  1:59 PM Macario HERO wrote: Reason for CRM: Patient is requesting a new patient visit with Dr. Glendia. Patient stated she knows Dr. Glendia because her son went to school with her daughter Dashley Monts. Patient requesting a call back.

## 2024-06-18 NOTE — Telephone Encounter (Signed)
Ok to schedule new patient appt

## 2024-09-16 ENCOUNTER — Other Ambulatory Visit: Payer: Self-pay

## 2024-09-16 DIAGNOSIS — Z1231 Encounter for screening mammogram for malignant neoplasm of breast: Secondary | ICD-10-CM

## 2024-09-17 ENCOUNTER — Ambulatory Visit
Admission: RE | Admit: 2024-09-17 | Discharge: 2024-09-17 | Disposition: A | Discharge status: Home / Self Care | Source: Ambulatory Visit

## 2024-09-17 DIAGNOSIS — Z1231 Encounter for screening mammogram for malignant neoplasm of breast: Secondary | ICD-10-CM | POA: Insufficient documentation

## 2024-09-23 ENCOUNTER — Ambulatory Visit: Admitting: Internal Medicine

## 2024-09-23 ENCOUNTER — Telehealth: Payer: Self-pay | Admitting: Internal Medicine

## 2024-09-23 VITALS — BP 130/80 | HR 86 | Temp 98.0°F | Ht 64.75 in | Wt 135.0 lb

## 2024-09-23 DIAGNOSIS — D219 Benign neoplasm of connective and other soft tissue, unspecified: Secondary | ICD-10-CM | POA: Diagnosis not present

## 2024-09-23 DIAGNOSIS — Z13220 Encounter for screening for lipoid disorders: Secondary | ICD-10-CM | POA: Diagnosis not present

## 2024-09-23 DIAGNOSIS — K635 Polyp of colon: Secondary | ICD-10-CM | POA: Diagnosis not present

## 2024-09-23 DIAGNOSIS — G43909 Migraine, unspecified, not intractable, without status migrainosus: Secondary | ICD-10-CM | POA: Diagnosis not present

## 2024-09-23 DIAGNOSIS — Z23 Encounter for immunization: Secondary | ICD-10-CM | POA: Diagnosis not present

## 2024-09-23 DIAGNOSIS — Z1211 Encounter for screening for malignant neoplasm of colon: Secondary | ICD-10-CM

## 2024-09-23 NOTE — Telephone Encounter (Signed)
 Lab order needed

## 2024-09-23 NOTE — Progress Notes (Signed)
 Subjective:    Patient ID: Summer Wilson, female    DOB: [birth year], 48 y.o.   MRN: [id number]  Patient here for  Chief Complaint  Patient presents with   Establish Care    New Patient    HPI Here to establish care. Previously seeing Dr Arloa - GYN. History of fibroids. Up to date with pap smears - last 09/2022. Also being followed for the fibroids.  Last gyn appt 10/24/24. Recommended to start zoloft. Did not start and does not feel she needs. Handling stress. Mammogram 09/20/24 - Birads I. Periods regular. No change. No increased bleeding. Also has a history of migraine headaches.  Has visual auras preceding. Has seen neurology previously - MRI ok. Previously treated with verapamil. Off now and doing well. No problem with increased headaches now. May occur - 1x/year. Takes advil. Stays active. Started pilates. No chest pain or sob. No abdominal pain or bowel change. Discussed family history - father - two stents/PM. Increased cholesterol and blood pressure. Mother - polyps. Paternal grandfather - colon cancer.    Past Medical History:  Diagnosis Date   Chicken pox    Fibroids    Migraine    Migraines    Past Surgical History:  Procedure Laterality Date   BUNIONECTOMY     CESAREAN SECTION     2004, 2007   COLONOSCOPY WITH PROPOFOL  N/A 10/28/2021   Procedure: COLONOSCOPY WITH PROPOFOL ;  Surgeon: Jinny Carmine, MD;  Location: [facility];  Service: Endoscopy;  Laterality: N/A;   POLYPECTOMY  10/28/2021   Procedure: POLYPECTOMY;  Surgeon: Jinny Carmine, MD;  Location: [facility];  Service: Endoscopy;;   Family History  Problem Relation Age of Onset   Diabetes Father    Heart disease Father    Hyperlipidemia Father    Hypertension Father    Brain cancer Maternal Aunt    Brain cancer Maternal Uncle    Diabetes Maternal Grandmother    Stroke Maternal Grandfather    Depression Paternal Grandmother    Cancer Paternal Grandfather    Colon cancer Paternal Grandfather     Breast cancer Cousin        2nd cousin   Social History   Socioeconomic History   Marital status: Married    Spouse name: Not on file   Number of children: Not on file   Years of education: Not on file   Highest education level: Master's degree (e.g., MA, MS, MEng, MEd, MSW, MBA)  Occupational History   Not on file  Tobacco Use   Smoking status: Never   Smokeless tobacco: Never  Vaping Use   Vaping status: Never Used  Substance and Sexual Activity   Alcohol use: Not Currently    Alcohol/week: 5.0 standard drinks of alcohol    Types: 5 Glasses of wine per week   Drug use: Never   Sexual activity: Yes    Birth control/protection: Condom  Other Topics Concern   Not on file  Social History Narrative   Not on file   Social Drivers of Health   Financial Resource Strain: Low Risk  (09/23/2024)   Overall Financial Resource Strain (CARDIA)    Difficulty of Paying Living Expenses: Not hard at all  Food Insecurity: No Food Insecurity (09/23/2024)   Hunger Vital Sign    Worried About Running Out of Food in the Last Year: Never true    Ran Out of Food in the Last Year: Never true  Transportation Needs: No Transportation Needs (09/23/2024)  PRAPARE - Administrator, Civil Service (Medical): No    Lack of Transportation (Non-Medical): No  Physical Activity: Sufficiently Active (09/23/2024)   Exercise Vital Sign    Days of Exercise per Week: 6 days    Minutes of Exercise per Session: 50 min  Stress: No Stress Concern Present (09/23/2024)   [employer] of Occupational Health - Occupational Stress Questionnaire    Feeling of Stress: Not at all  Social Connections: Socially Integrated (09/23/2024)   Social Connection and Isolation Panel    Frequency of Communication with Friends and Family: More than three times a week    Frequency of Social Gatherings with Friends and Family: Three times a week    Attends Religious Services: More than 4 times per year    Active  Member of Clubs or Organizations: Yes    Attends Banker Meetings: More than 4 times per year    Marital Status: Married     Review of Systems  Constitutional:  Negative for appetite change and unexpected weight change.  HENT:  Negative for congestion, sinus pressure and sore throat.   Eyes:  Negative for pain and visual disturbance.  Respiratory:  Negative for cough, chest tightness and shortness of breath.   Cardiovascular:  Negative for chest pain, palpitations and leg swelling.  Gastrointestinal:  Negative for abdominal pain, diarrhea, nausea and vomiting.  Genitourinary:  Negative for difficulty urinating and dysuria.  Musculoskeletal:  Negative for joint swelling and myalgias.  Skin:  Negative for color change and rash.  Neurological:  Negative for dizziness.       History of migraine headaches.   Hematological:  Negative for adenopathy. Does not bruise/bleed easily.  Psychiatric/Behavioral:  Negative for agitation and dysphoric mood.        Objective:     BP 130/80   Pulse 86   Temp 98 F (36.7 C) (Oral)   Ht 5' 4.75 (1.645 m)   Wt 135 lb (61.2 kg)   LMP 09/17/2024   SpO2 96%   BMI 22.64 kg/m  Wt Readings from Last 3 Encounters:  09/23/24 135 lb (61.2 kg)  10/28/21 132 lb (59.9 kg)  10/04/21 137 lb (62.1 kg)    Physical Exam Vitals reviewed.  Constitutional:      General: She is not in acute distress.    Appearance: Normal appearance.  HENT:     Head: Normocephalic and atraumatic.     Right Ear: External ear normal.     Left Ear: External ear normal.     Mouth/Throat:     Pharynx: No oropharyngeal exudate or posterior oropharyngeal erythema.  Eyes:     General: No scleral icterus.       Right eye: No discharge.        Left eye: No discharge.     Conjunctiva/sclera: Conjunctivae normal.  Neck:     Thyroid: No thyromegaly.  Cardiovascular:     Rate and Rhythm: Normal rate and regular rhythm.  Pulmonary:     Effort: No respiratory  distress.     Breath sounds: Normal breath sounds. No wheezing.  Abdominal:     General: Bowel sounds are normal.     Palpations: Abdomen is soft.     Tenderness: There is no abdominal tenderness.  Musculoskeletal:        General: No swelling or tenderness.     Cervical back: Neck supple. No tenderness.  Lymphadenopathy:     Cervical: No cervical adenopathy.  Skin:  Findings: No erythema or rash.  Neurological:     Mental Status: She is alert.  Psychiatric:        Mood and Affect: Mood normal.        Behavior: Behavior normal.         Outpatient Encounter Medications as of 09/23/2024  Medication Sig   BIOTIN PO Take by mouth.   Multiple Vitamin (MULTI-VITAMIN) tablet Take 1 tablet by mouth daily.   VITAMIN D , CHOLECALCIFEROL, PO Take by mouth.   [DISCONTINUED] verapamil (CALAN) 120 MG tablet  (Patient not taking: No sig reported)   No facility-administered encounter medications on file as of 09/23/2024.     Lab Results  Component Value Date   CHOL 176 08/11/2020   TRIG 106 08/11/2020   HDL 57 08/11/2020   LDLCALC 100 (H) 08/11/2020   TSH 2.010 08/11/2020   HGBA1C 4.8 02/14/2018    MM 3D SCREENING MAMMOGRAM BILATERAL BREAST Result Date: 09/20/2024 CLINICAL DATA:  Screening. EXAM: DIGITAL SCREENING BILATERAL MAMMOGRAM WITH TOMOSYNTHESIS AND CAD TECHNIQUE: Bilateral screening digital craniocaudal and mediolateral oblique mammograms were obtained. Bilateral screening digital breast tomosynthesis was performed. The images were evaluated with computer-aided detection. COMPARISON:  Previous exam(s). ACR Breast Density Category c: The breasts are heterogeneously dense, which may obscure small masses. FINDINGS: There are no findings suspicious for malignancy. IMPRESSION: No mammographic evidence of malignancy. A result letter of this screening mammogram will be mailed directly to the patient. RECOMMENDATION: Screening mammogram in one year. (Code:[id number]-B-01Y) BI-RADS CATEGORY  1:  Negative. Electronically Signed   By: Inocente Ast M.D.   On: 09/20/2024 08:55       Assessment & Plan:  Migraine without status migrainosus, not intractable, unspecified migraine type Assessment & Plan: History of migraines. Has been evaluated and worked up by neurology. Previous MRI ok. Off verapamil. Doing well. Takes advil If needed. Rarely occurs.   Orders: -     CBC with Differential/Platelet; Future -     Comprehensive metabolic panel with GFR; Future -     TSH; Future  Hyperplastic polyp of sigmoid colon Assessment & Plan: Colonoscopy 05/2021 - one 2 mm polyp and internal hemorrhoids. Hyperplastic polyp - repeat colonoscopy in 10 years.    Flu vaccine need -     Flu vaccine trivalent PF, 6mos and older(Flulaval,Afluria,Fluarix,Fluzone); Future  Screening cholesterol level Assessment & Plan: Check cholesterol levels.   Orders: -     Lipid panel; Future  Colon cancer screening Assessment & Plan: Colonoscopy 05/2021 - 2 cm polyp in sigmoid - pathology - hyperplastic. Per Dr Jinny - recommend f/u colonoscopy in 10 years.    Fibroids Assessment & Plan: Followed by gyn. Stable. No change with periods.       Allena Hamilton, MD

## 2024-09-23 NOTE — Assessment & Plan Note (Signed)
 Colonoscopy 05/2021 - one 2 mm polyp and internal hemorrhoids. Hyperplastic polyp - repeat colonoscopy in 10 years.

## 2024-09-24 DIAGNOSIS — Z13220 Encounter for screening for lipoid disorders: Secondary | ICD-10-CM | POA: Insufficient documentation

## 2024-09-24 NOTE — Telephone Encounter (Signed)
 Order placed for labs.

## 2024-09-29 ENCOUNTER — Encounter: Payer: Self-pay | Admitting: Internal Medicine

## 2024-09-29 DIAGNOSIS — D219 Benign neoplasm of connective and other soft tissue, unspecified: Secondary | ICD-10-CM | POA: Insufficient documentation

## 2024-09-29 NOTE — Assessment & Plan Note (Signed)
 History of migraines. Has been evaluated and worked up by neurology. Previous MRI ok. Off verapamil. Doing well. Takes advil If needed. Rarely occurs.

## 2024-09-29 NOTE — Assessment & Plan Note (Signed)
 Followed by gyn. Stable. No change with periods.

## 2024-09-29 NOTE — Assessment & Plan Note (Signed)
 Colonoscopy 05/2021 - 2 cm polyp in sigmoid - pathology - hyperplastic. Per Dr Jinny - recommend f/u colonoscopy in 10 years.

## 2024-09-29 NOTE — Assessment & Plan Note (Signed)
Check cholesterol levels 

## 2024-09-30 ENCOUNTER — Other Ambulatory Visit

## 2024-10-07 ENCOUNTER — Other Ambulatory Visit (INDEPENDENT_AMBULATORY_CARE_PROVIDER_SITE_OTHER)

## 2024-10-07 DIAGNOSIS — Z13220 Encounter for screening for lipoid disorders: Secondary | ICD-10-CM | POA: Diagnosis not present

## 2024-10-07 DIAGNOSIS — G43909 Migraine, unspecified, not intractable, without status migrainosus: Secondary | ICD-10-CM | POA: Diagnosis not present

## 2024-10-07 LAB — COMPREHENSIVE METABOLIC PANEL WITH GFR
ALT: 12 U/L (ref 0–35)
AST: 13 U/L (ref 0–37)
Albumin: 4 g/dL (ref 3.5–5.2)
Alkaline Phosphatase: 45 U/L (ref 39–117)
BUN: 15 mg/dL (ref 6–23)
CO2: 29 meq/L (ref 19–32)
Calcium: 8.6 mg/dL (ref 8.4–10.5)
Chloride: 102 meq/L (ref 96–112)
Creatinine, Ser: 0.65 mg/dL (ref 0.40–1.20)
GFR: 103.16 mL/min (ref >60.00)
Glucose, Bld: 96 mg/dL (ref 70–99)
Potassium: 3.6 meq/L (ref 3.5–5.1)
Sodium: 139 meq/L (ref 135–145)
Total Bilirubin: 0.3 mg/dL (ref 0.2–1.2)
Total Protein: 6.2 g/dL (ref 6.0–8.3)

## 2024-10-07 LAB — LIPID PANEL
Cholesterol: 162 mg/dL (ref 0–200)
HDL: 49.7 mg/dL (ref >39.00)
LDL Cholesterol: 54 mg/dL (ref 0–99)
NonHDL: 112.66
Total CHOL/HDL Ratio: 3
Triglycerides: 291 mg/dL — ABNORMAL HIGH (ref 0.0–149.0)
VLDL: 58.2 mg/dL — ABNORMAL HIGH (ref 0.0–40.0)

## 2024-10-07 LAB — CBC WITH DIFFERENTIAL/PLATELET
Basophils Absolute: 0 K/uL (ref 0.0–0.1)
Basophils Relative: 0.8 % (ref 0.0–3.0)
Eosinophils Absolute: 0.3 K/uL (ref 0.0–0.7)
Eosinophils Relative: 4.9 % (ref 0.0–5.0)
HCT: 36.8 % (ref 36.0–46.0)
Hemoglobin: 12.6 g/dL (ref 12.0–15.0)
Lymphocytes Relative: 35.2 % (ref 12.0–46.0)
Lymphs Abs: 1.9 K/uL (ref 0.7–4.0)
MCHC: 34.2 g/dL (ref 30.0–36.0)
MCV: 98.3 fl (ref 78.0–100.0)
Monocytes Absolute: 0.5 K/uL (ref 0.1–1.0)
Monocytes Relative: 10.1 % (ref 3.0–12.0)
Neutro Abs: 2.6 K/uL (ref 1.4–7.7)
Neutrophils Relative %: 49 % (ref 43.0–77.0)
Platelets: 220 K/uL (ref 150.0–400.0)
RBC: 3.75 Mil/uL — ABNORMAL LOW (ref 3.87–5.11)
RDW: 12.4 % (ref 11.5–15.5)
WBC: 5.3 K/uL (ref 4.0–10.5)

## 2024-10-07 LAB — TSH: TSH: 1.51 u[IU]/mL (ref 0.35–5.50)

## 2024-10-08 ENCOUNTER — Ambulatory Visit: Payer: Self-pay | Admitting: Internal Medicine

## 2025-09-25 ENCOUNTER — Encounter: Admitting: Internal Medicine
